# Patient Record
Sex: Female | Born: 1960 | Race: Black or African American | Hispanic: No | Marital: Married | Smoking: Former smoker
Health system: Southern US, Community
[De-identification: ages and names within clinical notes are randomized; demographics above are authoritative.]

## PROBLEM LIST (undated history)

## (undated) ENCOUNTER — Emergency Department (HOSPITAL_COMMUNITY): Admission: EM | Payer: Managed Care, Other (non HMO) | Source: Home / Self Care

## (undated) DIAGNOSIS — K219 Gastro-esophageal reflux disease without esophagitis: Secondary | ICD-10-CM

## (undated) HISTORY — DX: Gastro-esophageal reflux disease without esophagitis: K21.9

## (undated) HISTORY — PX: ABDOMINAL HYSTERECTOMY: SHX81

## (undated) HISTORY — PX: FOOT SURGERY: SHX648

---

## 2006-06-16 ENCOUNTER — Emergency Department (HOSPITAL_COMMUNITY): Admission: EM | Admit: 2006-06-16 | Discharge: 2006-06-16 | Payer: Self-pay | Admitting: Emergency Medicine

## 2007-01-05 ENCOUNTER — Emergency Department (HOSPITAL_COMMUNITY): Admission: EM | Admit: 2007-01-05 | Discharge: 2007-01-05 | Payer: Self-pay | Admitting: Emergency Medicine

## 2010-06-30 ENCOUNTER — Encounter: Payer: Self-pay | Admitting: Orthopedic Surgery

## 2010-06-30 ENCOUNTER — Emergency Department (HOSPITAL_COMMUNITY)
Admission: EM | Admit: 2010-06-30 | Discharge: 2010-06-30 | Payer: Self-pay | Source: Home / Self Care | Admitting: Emergency Medicine

## 2010-07-02 ENCOUNTER — Ambulatory Visit: Payer: Self-pay | Admitting: Orthopedic Surgery

## 2010-07-02 DIAGNOSIS — S92309A Fracture of unspecified metatarsal bone(s), unspecified foot, initial encounter for closed fracture: Secondary | ICD-10-CM | POA: Insufficient documentation

## 2010-07-02 DIAGNOSIS — S92209A Fracture of unspecified tarsal bone(s) of unspecified foot, initial encounter for closed fracture: Secondary | ICD-10-CM | POA: Insufficient documentation

## 2010-07-13 ENCOUNTER — Encounter: Payer: Self-pay | Admitting: Orthopedic Surgery

## 2010-07-18 ENCOUNTER — Ambulatory Visit: Payer: Self-pay | Admitting: Orthopedic Surgery

## 2010-07-19 ENCOUNTER — Encounter: Payer: Self-pay | Admitting: Orthopedic Surgery

## 2010-07-20 ENCOUNTER — Encounter: Payer: Self-pay | Admitting: Orthopedic Surgery

## 2010-08-15 ENCOUNTER — Encounter: Payer: Self-pay | Admitting: Orthopedic Surgery

## 2010-08-15 ENCOUNTER — Ambulatory Visit
Admission: RE | Admit: 2010-08-15 | Discharge: 2010-08-15 | Payer: Self-pay | Source: Home / Self Care | Attending: Orthopedic Surgery | Admitting: Orthopedic Surgery

## 2010-08-16 ENCOUNTER — Encounter: Payer: Self-pay | Admitting: Orthopedic Surgery

## 2010-08-20 ENCOUNTER — Telehealth: Payer: Self-pay | Admitting: Orthopedic Surgery

## 2010-09-11 NOTE — Assessment & Plan Note (Signed)
Summary: [facility] FX LEFT FOOT/BAS/INS/BSF   Visit Type:  new patient Referring Provider:  [facility]  CC:  left foot fracture.  History of Present Illness: I saw Bethany Winters in the office today for an initial visit.  She is a 50 years old woman with the complaint of:  left foot fracture  DOI 06-29-10. Fell  [facility] on 06-30-10.   Xrays were taken and shows fractures involving the bases of the first through fourth metatarsals.  Medications: Hydrocodone 5mg .  The patient got out of bed. Her foot was caught in a side rail and she fell down.  She complains of midfoot pain which is throbbing constant and associated with 8/10 in intensity relief. By 1-2 hydrocodone tablets. Her foot is swollen in the midfoot as well.  Allergies (verified): No Known Drug Allergies  Review of Systems Constitutional:  Denies weight loss, weight gain, fever, chills, and fatigue. Cardiovascular:  Denies chest pain, palpitations, fainting, and murmurs. Respiratory:  Denies short of breath, wheezing, couch, tightness, pain on inspiration, and snoring . Gastrointestinal:  Denies heartburn, nausea, vomiting, diarrhea, constipation, and blood in your stools. Genitourinary:  Denies frequency, urgency, difficulty urinating, painful urination, flank pain, and bleeding in urine. Neurologic:  Denies numbness, tingling, unsteady gait, dizziness, tremors, and seizure. Musculoskeletal:  Denies joint pain, swelling, instability, stiffness, redness, heat, and muscle pain. Endocrine:  Denies excessive thirst, exessive urination, and heat or cold intolerance. Psychiatric:  Denies nervousness, depression, anxiety, and hallucinations. Skin:  Denies changes in the skin, poor healing, rash, itching, and redness. HEENT:  Denies blurred or double vision, eye pain, redness, and watering. Immunology:  Denies seasonal allergies, sinus problems, and allergic to bee stings. Hemoatologic:  Denies easy bleeding and brusing.  Physical  Exam  Skin:  intact without lesions or rashes Psych:  alert and cooperative; normal mood and affect; normal attention span and concentration   Foot/Ankle Exam  General:    The patient is well developed and nourished, with normal grooming and hygiene. The body habitus is normal  Gait:    abnormal gait pattern with crutches, nonweightbearing  Skin:    Intact with no scars, lesions, rashes, cafe-au-lait spots or bruising.    Inspection:    swelling: LEFT foot  Palpation:    tenderness along the entire midfoot with no instability with the drawer test of the tarsometatarsal joint  Vascular:    dorsalis pedis and posterior tibial pulses 2+ and symmetric, capillary refill < 2 seconds, normal hair pattern, no evidence of ischemia.   Sensory:    gross sensation intact bilaterally in lower extremities.    Motor:    normal muscle tone   Ankle Exam:    Left:    Inspection:  Abnormal    Palpation:  Abnormal    Stability:  stable    Tenderness:  yes, midfoot     Swelling:  yes   Impression & Recommendations:  Problem # 1:  OTHER CLOSED FRACTURE OF TARSAL&METATARSAL BONES (ICD-825.29) Assessment New  The x-rays were done at [facility]. The report and the films have been reviewed. the foot alignment is normal there are fractures 1-5 with no displacement;   I strongly emphasized NO WEIGHT BEARING or it may displace and need surgery   Orders: New Patient Level III (06237) Metacarpal Fx (62831)  Medications Added to Medication List This Visit: 1)  Norco 10-325 Mg Tabs (Hydrocodone-acetaminophen) .Marland Kitchen.. 1 by mouth q 4 as needed  Patient Instructions: 1)  No weightbearing at all  2)  Do not get cast wet, if you do call us  3)  Come back in 2 weeks xrays in plaster 4)  OOW for 6 weeks Prescriptions: NORCO 10-325 MG TABS (HYDROCODONE-ACETAMINOPHEN) 1 by mouth q 4 as needed  #84 x 0   Entered and Authorized by:   Fuller Canada MD   Signed by:   Fuller Canada MD on 07/02/2010    Method used:   Print then Give to Patient   RxID:   [id number]    Orders Added: 1)  New Patient Level III [62130] 2)  Metacarpal Fx [26600]

## 2010-09-11 NOTE — Letter (Signed)
Summary: Out of Work  [facility]  [address]  [place], [place] [address]   Phone: [phone]  Fax: [phone]    July 02, 2010   Employee:  Bethany Winters    To Whom It May Concern:   For Medical reasons, please excuse the above named employee from work for the following dates:  Start out of work :   Monday, 07/02/10             End:   Tuesday, August 14, 2009, or until further notice  If you need additional information, please feel free to contact our office.         Sincerely,    Terrance Mass, MD

## 2010-09-11 NOTE — Letter (Signed)
Summary: Out of Work  [facility]  [address]  [place], [place] [address]   Phone: [phone]  Fax: [phone]    July 18, 2010   Employee:  ANARIA KRONER Rettinger    To Whom It May Concern:   For Medical reasons, please excuse the above named employee from work for the following dates:  Start out of work :    Monday, 07/02/10             Estimated End date:   08/27/10,  or until further notice   Next appointment date:  08/15/10    If you need additional information, please feel free to contact our office.         Sincerely,    Terrance Mass, MD

## 2010-09-11 NOTE — Assessment & Plan Note (Signed)
Summary: 2 WK RE-CK/XRAYS LT FOOT/FX CARE/CIG[employer]/CAF   Visit Type:  Follow-up Referring Dali Kraner:  [facility]  CC:  left foot fracture.  History of Present Illness: I saw Bethany Winters in the office today for a 2 week followup visit.  She is a 50 years old woman with the complaint of:  left foot  ZO:XWRUEAVWU #1 through 4, LEFT foot  DOI: November 18  Treatment and date: short-leg nonweightbearing cast  Plan for today: x-rays in cast  Meds: norco 10/25   Complaints: none   x-rays today show that the midtarsal joints are intact. The metatarsal tarsal joints are intact. Fractures are healing appropriately, in good position.  Continue nonweightbearing. X-rays out of plaster next visit    Allergies: No Known Drug Allergies   Other Orders: Post-Op Check (98119) Foot x-ray complete, minimum 3 views (14782)  Patient Instructions: 1)  return 1 st week of Jan for cast off xrays  2)  stay no weight bearing    Orders Added: 1)  Post-Op Check [99024] 2)  Foot x-ray complete, minimum 3 views [73630]

## 2010-09-11 NOTE — Letter (Signed)
Summary: History form  History form   Imported By: Jacklynn Ganong 07/04/2010 08:33:14  _____________________________________________________________________  External Attachment:    Type:   Image     Comment:   External Document

## 2010-09-11 NOTE — Letter (Signed)
Summary: Out of Work Updated  [facility]  [address]  [place], [place] [address]   Phone: [phone]  Fax: [phone]    July 13, 2010   Employee:  Bethany Winters    To Whom It May Concern:   For Medical reasons, please excuse the above named employee from work for the following dates:   Start out of work :    Monday, 07/02/10                         End:     Tuesday,  08/14/10, or until further notice                          (Next Appointment: 07/18/10 @ 11:30AM)    If you need additional information, please feel free to contact our office.         Sincerely,    Terrance Mass, MD

## 2010-09-13 NOTE — Letter (Signed)
Summary: [employer] dis[employer] insurance func abilities form  [employer] dis[employer] insurance func abilities form   Imported By: Cammie Sickle 08/21/2010 14:48:49  _____________________________________________________________________  External Attachment:    Type:   Image     Comment:   External Document

## 2010-09-13 NOTE — Letter (Signed)
Summary: [employer] disab form  [employer] dis[employer] form   Imported By: Cammie Sickle 07/23/2010 13:41:13  _____________________________________________________________________  External Attachment:    Type:   Image     Comment:   External Document

## 2010-09-13 NOTE — Letter (Signed)
Summary: Disability form  Disability form   Imported By: Cammie Sickle 08/10/2010 17:18:24  _____________________________________________________________________  External Attachment:    Type:   Image     Comment:   External Document

## 2010-09-13 NOTE — Medication Information (Signed)
Summary: RX Folder  RX Folder   Imported By: Cammie Sickle 08/15/2010 19:44:16  _____________________________________________________________________  External Attachment:    Type:   Image     Comment:   External Document

## 2010-09-13 NOTE — Letter (Signed)
Summary: Out of Work  [facility]  [address]  [place], [place] [address]   Phone: [phone]  Fax: [phone]    August 15, 2010   Employee:  KAYLIEE ATIENZA Schertzer    To Whom It May Concern:   For Medical reasons, please excuse the above named employee from work for the following dates:  Start:   07/02/2010  End/Medically cleared to return to work full duty, no restrictions:  08/27/2010   If you need additional information, please feel free to contact our office.         Sincerely,    Terrance Mass, MD

## 2010-09-13 NOTE — Progress Notes (Signed)
Summary: foot is swelling  Phone Note Call from Patient   Summary of Call: Bethany Winters (Feb 05, 2061) concerned that her foot is still swollen, she is icing and elevating and walking on it some.  Scheduled to go back to work 08/27/10 and her shoe will not fit her foot due to the swelling.  Anything else she can do to get the swelling down? [phone]  Follow-up for Phone Call        order her a ted hose low pressure use pre pprinted car apoth orders  Follow-up by: Fuller Canada MD,  August 20, 2010 1:54 PM  Additional Follow-up for Phone Call Additional follow up Details #1::        see fol/up note, ordered 08/20/10 [facility] per nurse. Additional Follow-up by: Cammie Sickle,  August 21, 2010 9:07 AM    New/Updated Medications: * TED HOSE wear 8 hrs per day for swelling Prescriptions: TED HOSE wear 8 hrs per day for swelling  #1 pair x 0   Entered by:   Ether Griffins   Authorized by:   Fuller Canada MD   Signed by:   Ether Griffins on 08/20/2010   Method used:   Faxed to ...       [facility]* (retail)       [address]       [place], [place]  [address]       Ph: [phone]       Fax: [phone]   RxID:   [id number]

## 2010-09-13 NOTE — Progress Notes (Signed)
Summary: knee or thigh high hose?  Phone Note Other Incoming   Summary of Call: Bethany Winters ([birth year]) Doris at [facility] needs to know if the stockings for Bethany Winters is knee or thigh high. Doris's # [phone] Initial call taken by: Jacklynn Ganong,  August 20, 2010 3:17 PM  Follow-up for Phone Call        knee Follow-up by: Fuller Canada MD,  August 20, 2010 3:20 PM  Additional Follow-up for Phone Call Additional follow up Details #1::        Doris/[facility] advised Additional Follow-up by: Jacklynn Ganong,  August 20, 2010 3:23 PM

## 2010-09-13 NOTE — Assessment & Plan Note (Signed)
Summary: RE-CK/XRAYS LT FOOT/FX CARE/[employer]IG[employer]/CAF   Visit Type:  Follow-up Referring Provider:  [facility]  CC:  left foot fracture.  History of Present Illness: I saw Laterria Gasca in the office today for a followup visit.  She is a 50 years old woman with the complaint of:  left foot fracture.  ZO:XWRUEAVWU #1 through 4, LEFT foot/LISFRANC ?   DOI: November 18  7 WEEKS +   Treatment and date: short-leg nonweightbearing cast  Plan for today: x-rays OOP  Meds: norco 7.5 as needed.  Complaints: none.  Allergies: No Known Drug Allergies   Impression & Recommendations:  Problem # 1:  OTHER CLOSED FRACTURE OF TARSAL&METATARSAL BONES (ICD-825.29)  3 views LEFT foot  x-rays today x-rays show osteopenia multiple fractures seemed to have healed nondisplaced no displacement of the Lisfranc joint  Impression healed multiple fractures near the Lisfranc joint  Recommended orthotics for 6 weeks return to work on 16 January follow up as needed  Orders: Post-Op Check (98119) Foot x-ray complete, minimum 3 views (14782)  Patient Instructions: 1)  Get orthotics from [facility] 2)  Wean off of the crutches 3)  Go back to work 08/27/10 needs note 4)  Come back as needed.   Orders Added: 1)  Post-Op Check [99024] 2)  Foot x-ray complete, minimum 3 views [73630]

## 2010-09-13 NOTE — Letter (Signed)
Summary: Work note fax to employer  Work note fax to employer   Imported By: Cammie Sickle 08/15/2010 19:43:58  _____________________________________________________________________  External Attachment:    Type:   Image     Comment:   External Document

## 2010-12-24 ENCOUNTER — Emergency Department (HOSPITAL_COMMUNITY)
Admission: EM | Admit: 2010-12-24 | Discharge: 2010-12-24 | Disposition: A | Discharge status: Home / Self Care | Payer: 59 | Attending: Emergency Medicine | Admitting: Emergency Medicine

## 2010-12-24 DIAGNOSIS — L259 Unspecified contact dermatitis, unspecified cause: Secondary | ICD-10-CM | POA: Insufficient documentation

## 2010-12-24 DIAGNOSIS — F17200 Nicotine dependence, unspecified, uncomplicated: Secondary | ICD-10-CM | POA: Insufficient documentation

## 2010-12-24 DIAGNOSIS — R21 Rash and other nonspecific skin eruption: Secondary | ICD-10-CM | POA: Insufficient documentation

## 2012-07-05 ENCOUNTER — Emergency Department (HOSPITAL_COMMUNITY)
Admission: EM | Admit: 2012-07-05 | Discharge: 2012-07-05 | Disposition: A | Discharge status: Home / Self Care | Payer: Managed Care, Other (non HMO) | Attending: Emergency Medicine | Admitting: Emergency Medicine

## 2012-07-05 ENCOUNTER — Emergency Department (HOSPITAL_COMMUNITY): Payer: Managed Care, Other (non HMO)

## 2012-07-05 ENCOUNTER — Encounter (HOSPITAL_COMMUNITY): Payer: Self-pay | Admitting: *Deleted

## 2012-07-05 DIAGNOSIS — F17200 Nicotine dependence, unspecified, uncomplicated: Secondary | ICD-10-CM | POA: Insufficient documentation

## 2012-07-05 DIAGNOSIS — R0789 Other chest pain: Secondary | ICD-10-CM

## 2012-07-05 DIAGNOSIS — Z8719 Personal history of other diseases of the digestive system: Secondary | ICD-10-CM | POA: Insufficient documentation

## 2012-07-05 LAB — HEPATIC FUNCTION PANEL
Alkaline Phosphatase: 82 U/L (ref 39–117)
Total Bilirubin: 0.1 mg/dL — ABNORMAL LOW (ref 0.3–1.2)
Total Protein: 7.3 g/dL (ref 6.0–8.3)

## 2012-07-05 LAB — BASIC METABOLIC PANEL
Calcium: 9.4 mg/dL (ref 8.4–10.5)
GFR calc Af Amer: >90 mL/min (ref >90)
GFR calc non Af Amer: >90 mL/min (ref >90)
Glucose, Bld: 117 mg/dL — ABNORMAL HIGH (ref 70–99)
Sodium: 141 mEq/L (ref 135–145)

## 2012-07-05 LAB — CBC
Hemoglobin: 13.7 g/dL (ref 12.0–15.0)
MCH: 29.7 pg (ref 26.0–34.0)
MCHC: 33.7 g/dL (ref 30.0–36.0)
RDW: 13.6 % (ref 11.5–15.5)

## 2012-07-05 LAB — LIPASE, BLOOD: Lipase: 41 U/L (ref 11–59)

## 2012-07-05 MED ORDER — FAMOTIDINE 20 MG PO TABS: 20.0000 mg | ORAL_TABLET | Freq: Two times a day (BID) | ORAL | Status: DC

## 2012-07-05 MED ORDER — IBUPROFEN 600 MG PO TABS: 600.0000 mg | ORAL_TABLET | Freq: Four times a day (QID) | ORAL | Status: DC | PRN

## 2012-07-05 MED ORDER — GI COCKTAIL ~~LOC~~
30.0000 mL | Freq: Once | ORAL | Status: AC
  Administered 2012-07-05: 30 mL via ORAL
  Filled 2012-07-05: qty 30

## 2012-07-05 MED ORDER — NITROGLYCERIN 0.4 MG SL SUBL
0.4000 mg | SUBLINGUAL_TABLET | Freq: Once | SUBLINGUAL | Status: AC
  Administered 2012-07-05: 0.4 mg via SUBLINGUAL

## 2012-07-05 MED ORDER — ASPIRIN 81 MG PO CHEW
324.0000 mg | CHEWABLE_TABLET | Freq: Once | ORAL | Status: AC
  Administered 2012-07-05: 324 mg via ORAL
  Filled 2012-07-05: qty 4

## 2012-07-05 MED ORDER — NITROGLYCERIN 0.4 MG SL SUBL
0.4000 mg | SUBLINGUAL_TABLET | Freq: Once | SUBLINGUAL | Status: AC
  Administered 2012-07-05: 0.4 mg via SUBLINGUAL
  Filled 2012-07-05: qty 25

## 2012-07-05 NOTE — ED Provider Notes (Signed)
History     CSN: [id number]  Arrival date & time 07/05/12  0806   First MD Initiated Contact with Patient 07/05/12 0818      Chief Complaint  Patient presents with  . Chest Pain    (Consider location/radiation/quality/duration/timing/severity/associated sxs/prior treatment) HPI Comments: Bethany Winters presents with constant midsternal chest "aching" which radiates into her mid back since last night.  She describes sitting on her couch watching a movie,  When she developed symptoms around 1 am.  She denies any shortness of breath, nausea, vomiting,  Diaphoresis,  Abdominal pain or palpitations.  She does have a history of acid reflux but states this is not a burning pain like her usual reflux.  She has taken no medications prior to arrival.  She is a smoker, but denies other risk factors for cardiac disease including no history of hypertension,  Elevated cholesterol (was just checked at a recent job health fair) and no family history of cardiac disease.  She has had no swelling or pain in her legs.  She has taken no medications prior to arrival.  The history is provided by the patient and the spouse.    History reviewed. No pertinent past medical history.  Past Surgical History  Procedure Date  . Foot surgery     left  . Abdominal hysterectomy     partial    No family history on file.  History  Substance Use Topics  . Smoking status: Current Every Day Smoker    Types: Cigarettes  . Smokeless tobacco: Not on file  . Alcohol Use: Yes     Comment: weekends    OB History    Grav Para Term Preterm Abortions TAB SAB Ect Mult Living                  Review of Systems  Constitutional: Negative for fever.  HENT: Negative for congestion, sore throat and neck pain.   Eyes: Negative.   Respiratory: Negative for cough, shortness of breath and wheezing.   Cardiovascular: Positive for chest pain. Negative for palpitations and leg swelling.  Gastrointestinal: Negative for  nausea and abdominal pain.  Genitourinary: Negative.   Musculoskeletal: Negative for joint swelling and arthralgias.  Skin: Negative.  Negative for rash and wound.  Neurological: Negative for dizziness, weakness, light-headedness, numbness and headaches.  Hematological: Negative.   Psychiatric/Behavioral: Negative.     Allergies  Review of patient's allergies indicates no known allergies.  Home Medications  No current outpatient prescriptions on file.  BP 144/93  Pulse 66  Temp 98.9 F (37.2 C) (Oral)  Resp 16  Ht 5\' 4"  (1.626 m)  Wt 140 lb (63.504 kg)  BMI 24.03 kg/m2  SpO2 100%  LMP 07/05/2012  Physical Exam  Nursing note and vitals reviewed. Constitutional: She appears well-developed and well-nourished.  HENT:  Head: Normocephalic and atraumatic.  Eyes: Conjunctivae normal are normal.  Neck: Normal range of motion.  Cardiovascular: Normal rate, regular rhythm, normal heart sounds and intact distal pulses.  Exam reveals no gallop and no friction rub.   No murmur heard. Pulmonary/Chest: Effort normal and breath sounds normal. She has no wheezes. She exhibits tenderness.       Mild ttp midsternum.  Abdominal: Soft. Bowel sounds are normal. There is no tenderness.  Musculoskeletal: Normal range of motion. She exhibits no edema and no tenderness.  Neurological: She is alert.  Skin: Skin is warm and dry.  Psychiatric: She has a normal mood and affect.  ED Course  Procedures (including critical care time)   Labs Reviewed  CBC  BASIC METABOLIC PANEL  TROPONIN I  HEPATIC FUNCTION PANEL  LIPASE, BLOOD   Dg Chest Port 1 View  07/05/2012  *RADIOLOGY REPORT*  Clinical Data: Chest pain  CHEST - 1 VIEW  Comparison:  None.  Findings: The heart size and mediastinal contours are within normal limits.  Both lungs are clear.  IMPRESSION: No active disease.   Original Report Authenticated By: Judie Petit. Miles Costain, M.D.      No diagnosis found.    MDM  Labs and xray reviewed and  discussed with pt.  Atypical chest pain of unclear etiology,  However,  She did get some improvement once the GI cocktail was given.  Will cover for possible atypical gerd with pepcid.  Also with slightly reproducible quality of pain,  Will add ibuprofen for possible costochondritis.  Referral given for pcp,  Also for cardiology.  Stressed to return here for any worsened or persistent symptoms.  Pt is PERC negative with no risk factors or presentation suggesting PE.    Date: 07/05/2012  Rate: 73  Rhythm: normal sinus rhythm  QRS Axis: normal  Intervals: normal  ST/T Wave abnormalities: normal  Conduction Disutrbances:none  Narrative Interpretation:   Old EKG Reviewed: none available       Burgess Amor, PA 07/05/12 1641

## 2012-07-05 NOTE — ED Notes (Signed)
Central cp that started last night.  Denies sob/n/v/d/dizziness.

## 2012-07-06 NOTE — ED Provider Notes (Signed)
Medical screening examination/treatment/procedure(s) were performed by non-physician practitioner and as supervising physician I was immediately available for consultation/collaboration.   Karle Desrosier L Nam Vossler, MD 07/06/12 1308 

## 2012-07-21 ENCOUNTER — Other Ambulatory Visit (HOSPITAL_COMMUNITY): Payer: Self-pay | Admitting: Internal Medicine

## 2012-07-21 DIAGNOSIS — Z139 Encounter for screening, unspecified: Secondary | ICD-10-CM

## 2012-07-27 ENCOUNTER — Ambulatory Visit (HOSPITAL_COMMUNITY): Payer: 59

## 2012-08-10 ENCOUNTER — Ambulatory Visit (HOSPITAL_COMMUNITY)
Admission: RE | Admit: 2012-08-10 | Discharge: 2012-08-10 | Disposition: A | Discharge status: Home / Self Care | Payer: Managed Care, Other (non HMO) | Source: Ambulatory Visit | Attending: Internal Medicine | Admitting: Internal Medicine

## 2012-08-10 DIAGNOSIS — Z139 Encounter for screening, unspecified: Secondary | ICD-10-CM

## 2012-08-10 DIAGNOSIS — Z1231 Encounter for screening mammogram for malignant neoplasm of breast: Secondary | ICD-10-CM | POA: Insufficient documentation

## 2013-01-20 ENCOUNTER — Emergency Department (HOSPITAL_COMMUNITY)
Admission: EM | Admit: 2013-01-20 | Discharge: 2013-01-20 | Disposition: A | Discharge status: Home / Self Care | Payer: Managed Care, Other (non HMO) | Attending: Emergency Medicine | Admitting: Emergency Medicine

## 2013-01-20 ENCOUNTER — Emergency Department (HOSPITAL_COMMUNITY): Payer: Managed Care, Other (non HMO)

## 2013-01-20 ENCOUNTER — Encounter (HOSPITAL_COMMUNITY): Payer: Self-pay | Admitting: Emergency Medicine

## 2013-01-20 DIAGNOSIS — F17200 Nicotine dependence, unspecified, uncomplicated: Secondary | ICD-10-CM | POA: Insufficient documentation

## 2013-01-20 DIAGNOSIS — S92912A Unspecified fracture of left toe(s), initial encounter for closed fracture: Secondary | ICD-10-CM

## 2013-01-20 DIAGNOSIS — Y9289 Other specified places as the place of occurrence of the external cause: Secondary | ICD-10-CM | POA: Insufficient documentation

## 2013-01-20 DIAGNOSIS — Y990 Civilian activity done for income or pay: Secondary | ICD-10-CM | POA: Insufficient documentation

## 2013-01-20 DIAGNOSIS — S92919A Unspecified fracture of unspecified toe(s), initial encounter for closed fracture: Secondary | ICD-10-CM | POA: Insufficient documentation

## 2013-01-20 DIAGNOSIS — Y9302 Activity, running: Secondary | ICD-10-CM | POA: Insufficient documentation

## 2013-01-20 DIAGNOSIS — W2209XA Striking against other stationary object, initial encounter: Secondary | ICD-10-CM | POA: Insufficient documentation

## 2013-01-20 DIAGNOSIS — Z9889 Other specified postprocedural states: Secondary | ICD-10-CM | POA: Insufficient documentation

## 2013-01-20 MED ORDER — HYDROCODONE-ACETAMINOPHEN 5-325 MG PO TABS
ORAL_TABLET | ORAL | Status: DC
Start: 2013-01-20 — End: 2015-09-27

## 2013-01-20 MED ORDER — NAPROXEN 500 MG PO TABS: 500.0000 mg | ORAL_TABLET | Freq: Two times a day (BID) | ORAL | Status: DC

## 2013-01-20 NOTE — ED Notes (Signed)
Pt c/o left 5th toe pain after hitting on couch yesterday evening.

## 2013-01-20 NOTE — Discharge Instructions (Signed)
Toe Fracture Your caregiver has diagnosed you as having a fractured toe. A toe fracture is a break in the bone of a toe. "Buddy taping" is a way of splinting your broken toe, by taping the broken toe to the toe next to it. This "buddy taping" will keep the injured toe from moving beyond normal range of motion. Buddy taping also helps the toe heal in a more normal alignment. It may take 6 to 8 weeks for the toe injury to heal. HOME CARE INSTRUCTIONS   Leave your toes taped together for as long as directed by your caregiver or until you see a doctor for a follow-up examination. You can change the tape after bathing. Always use a small piece of gauze or cotton between the toes when taping them together. This will help the skin stay dry and prevent infection.  Apply ice to the injury for 15-20 minutes each hour while awake for the first 2 days. Put the ice in a plastic bag and place a towel between the bag of ice and your skin.  After the first 2 days, apply heat to the injured area. Use heat for the next 2 to 3 days. Place a heating pad on the foot or soak the foot in warm water as directed by your caregiver.  Keep your foot elevated as much as possible to lessen swelling.  Wear sturdy, supportive shoes. The shoes should not pinch the toes or fit tightly against the toes.  Your caregiver may prescribe a rigid shoe if your foot is very swollen.  Your may be given crutches if the pain is too great and it hurts too much to walk.  Only take over-the-counter or prescription medicines for pain, discomfort, or fever as directed by your caregiver.  If your caregiver has given you a follow-up appointment, it is very important to keep that appointment. Not keeping the appointment could result in a chronic or permanent injury, pain, and disability. If there is any problem keeping the appointment, you must call back to this facility for assistance. SEEK MEDICAL CARE IF:   You have increased pain or swelling,  not relieved with medications.  The pain does not get better after 1 week.  Your injured toe is cold when the others are warm. SEEK IMMEDIATE MEDICAL CARE IF:   The toe becomes cold, numb, or white.  The toe becomes hot (inflamed) and red. Document Released: 07/26/2000 Document Revised: 10/21/2011 Document Reviewed: 03/14/2008 [facility] Patient Information 2014 [place], [place].  Hard-Soled Shoe Use This is a flat, soft shoe with a hard (sometimes wood) sole. It is used for toe fractures and certain foot surgeries. Your doctor will tell you how much weight to put on your foot. HOME CARE INSTRUCTIONS   Lace the shoe to make it secure and comfortable. You do not want to feel pressure or rubbing on the painful area.  Follow instructions for wear as directed by your caregiver. Document Released: 05/03/2004 Document Revised: 10/21/2011 Document Reviewed: 07/29/2005 [facility] Patient Information 2014 [place], [place].

## 2013-01-20 NOTE — ED Notes (Signed)
Pt presents with left 5th toe injury and swelling after running into sofa last night. Pt reports buddy taped toes together, has helped, however is painful to wear steel-toed shoes at work. NAD noted. Pain has increased due to standing at work.

## 2013-01-23 NOTE — ED Provider Notes (Signed)
History     CSN: [id number]  Arrival date & time 01/20/13  1203   First MD Initiated Contact with Patient 01/20/13 1251      Chief Complaint  Patient presents with  . Toe Pain    (Consider location/radiation/quality/duration/timing/severity/associated sxs/prior treatment) HPI Comments: Bethany Winters is a 52 y.o. female who presents to the Emergency Department complaining of left fifth toe pain after a direct blow against a sofa.  C/o pain and swelling.  Pt has tried tylenol and ice packs without relief.  States that she has to wear steel toe shoes at her job and has pain with attempted walking or standing.  She denies proximal pain or swelling.   Patient is a 52 y.o. female presenting with toe pain. The history is provided by the patient.  Toe Pain This is a new problem. The current episode started yesterday. The problem occurs constantly. The problem has been unchanged. Associated symptoms include arthralgias and joint swelling. Pertinent negatives include no chills, fever, neck pain, numbness, rash, sore throat, vomiting or weakness. The symptoms are aggravated by standing and walking. She has tried acetaminophen and ice for the symptoms. The treatment provided no relief.    History reviewed. No pertinent past medical history.  Past Surgical History  Procedure Laterality Date  . Foot surgery      left  . Abdominal hysterectomy      partial    No family history on file.  History  Substance Use Topics  . Smoking status: Current Every Day Smoker -- 0.50 packs/day    Types: Cigarettes  . Smokeless tobacco: Not on file  . Alcohol Use: Yes     Comment: weekends    OB History   Grav Para Term Preterm Abortions TAB SAB Ect Mult Living                  Review of Systems  Constitutional: Negative for fever and chills.  HENT: Negative for sore throat and neck pain.   Gastrointestinal: Negative for vomiting.  Genitourinary: Negative for dysuria and difficulty urinating.   Musculoskeletal: Positive for joint swelling, arthralgias and gait problem. Negative for back pain.  Skin: Negative for color change, rash and wound.  Neurological: Negative for weakness and numbness.  All other systems reviewed and are negative.    Allergies  Review of patient's allergies indicates no known allergies.  Home Medications   Current Outpatient Rx  Name  Route  Sig  Dispense  Refill  . acetaminophen (TYLENOL) 500 MG tablet   Oral   Take 1,000 mg by mouth every 4 (four) hours as needed for pain.         Marland Kitchen HYDROcodone-acetaminophen (NORCO/VICODIN) 5-325 MG per tablet      Take one-two tabs po q 4-6 hrs prn pain   20 tablet   0   . naproxen (NAPROSYN) 500 MG tablet   Oral   Take 1 tablet (500 mg total) by mouth 2 (two) times daily.   20 tablet   0     BP 108/76  Pulse 73  Temp(Src) 97.9 F (36.6 C) (Oral)  Resp 20  Ht 5\' 4"  (1.626 m)  Wt 138 lb (62.596 kg)  BMI 23.68 kg/m2  SpO2 96%  LMP 07/05/2012  Physical Exam  Nursing note and vitals reviewed. Constitutional: She is oriented to person, place, and time. She appears well-developed and well-nourished. No distress.  HENT:  Head: Normocephalic and atraumatic.  Cardiovascular: Normal rate, regular rhythm, normal  heart sounds and intact distal pulses.   No murmur heard. Pulmonary/Chest: Effort normal and breath sounds normal. No respiratory distress.  Musculoskeletal: She exhibits edema and tenderness.  ttp of the left fifth toe.  Mild STS.  ROM is preserved.  DP pulse is brisk, distal sensation intact.  No erythema, abrasion, open wound, red streaks or bony deformity.    Neurological: She is alert and oriented to person, place, and time. She exhibits normal muscle tone. Coordination normal.  Skin: Skin is warm and dry.    ED Course  Procedures (including critical care time)  Labs Reviewed - No data to display No results found. Dg Foot Complete Left  01/20/2013   *RADIOLOGY REPORT*  Clinical  Data: Fifth toe injury with pain.  LEFT FOOT - COMPLETE 3+ VIEW  Comparison: 06/30/2010.  Findings: There is a nondisplaced fracture involving the fifth proximal phalangeal shaft.  Fracture line does not appear to extend to the metatarsal phalangeal joint.  Healed fractures at the bases of the first through fourth metatarsals are seen.  Small calcaneal spur.  IMPRESSION:  1.  Acute nondisplaced fracture of the fifth proximal shaft. 2.  Healed fractures of the bases of the first through fourth metatarsals.   Original Report Authenticated By: Leanna Battles, M.D.    1. Toe fracture, left, closed, initial encounter       MDM    Left 4th and 5th toes were buddy taped, pain improved, remains NV intact.  Post-op shoe applied.  Pt agrees to rest, elevate extremity and ice.  Given referral info for orthopedics if needed   VSS.  Appears stable for d/c   Welma Mccombs L. Tiernan Millikin, PA-C 01/23/13 1708

## 2013-01-31 NOTE — ED Provider Notes (Signed)
Medical screening examination/treatment/procedure(s) were performed by non-physician practitioner and as supervising physician I was immediately available for consultation/collaboration.   Caden Fatica L Britney Captain, MD 01/31/13 0254 

## 2013-07-27 ENCOUNTER — Other Ambulatory Visit (HOSPITAL_COMMUNITY): Payer: Self-pay | Admitting: Internal Medicine

## 2013-07-27 DIAGNOSIS — Z139 Encounter for screening, unspecified: Secondary | ICD-10-CM

## 2013-08-16 ENCOUNTER — Ambulatory Visit (HOSPITAL_COMMUNITY)
Admission: RE | Admit: 2013-08-16 | Discharge: 2013-08-16 | Disposition: A | Discharge status: Home / Self Care | Payer: Managed Care, Other (non HMO) | Source: Ambulatory Visit | Attending: Internal Medicine | Admitting: Internal Medicine

## 2013-08-16 DIAGNOSIS — Z139 Encounter for screening, unspecified: Secondary | ICD-10-CM

## 2013-08-16 DIAGNOSIS — Z1231 Encounter for screening mammogram for malignant neoplasm of breast: Secondary | ICD-10-CM | POA: Insufficient documentation

## 2013-10-29 ENCOUNTER — Encounter (HOSPITAL_COMMUNITY): Payer: Self-pay | Admitting: Emergency Medicine

## 2013-10-29 ENCOUNTER — Emergency Department (HOSPITAL_COMMUNITY): Payer: Managed Care, Other (non HMO)

## 2013-10-29 ENCOUNTER — Emergency Department (HOSPITAL_COMMUNITY)
Admission: EM | Admit: 2013-10-29 | Discharge: 2013-10-29 | Disposition: A | Discharge status: Home / Self Care | Payer: Managed Care, Other (non HMO) | Attending: Emergency Medicine | Admitting: Emergency Medicine

## 2013-10-29 DIAGNOSIS — Y929 Unspecified place or not applicable: Secondary | ICD-10-CM | POA: Insufficient documentation

## 2013-10-29 DIAGNOSIS — S93409A Sprain of unspecified ligament of unspecified ankle, initial encounter: Secondary | ICD-10-CM | POA: Insufficient documentation

## 2013-10-29 DIAGNOSIS — Z791 Long term (current) use of non-steroidal anti-inflammatories (NSAID): Secondary | ICD-10-CM | POA: Insufficient documentation

## 2013-10-29 DIAGNOSIS — S93401A Sprain of unspecified ligament of right ankle, initial encounter: Secondary | ICD-10-CM

## 2013-10-29 DIAGNOSIS — X500XXA Overexertion from strenuous movement or load, initial encounter: Secondary | ICD-10-CM | POA: Insufficient documentation

## 2013-10-29 DIAGNOSIS — W19XXXA Unspecified fall, initial encounter: Secondary | ICD-10-CM | POA: Insufficient documentation

## 2013-10-29 DIAGNOSIS — Y939 Activity, unspecified: Secondary | ICD-10-CM | POA: Insufficient documentation

## 2013-10-29 DIAGNOSIS — F17200 Nicotine dependence, unspecified, uncomplicated: Secondary | ICD-10-CM | POA: Insufficient documentation

## 2013-10-29 MED ORDER — IBUPROFEN 800 MG PO TABS
800.0000 mg | ORAL_TABLET | Freq: Once | ORAL | Status: AC
  Administered 2013-10-29: 800 mg via ORAL
  Filled 2013-10-29: qty 1

## 2013-10-29 MED ORDER — HYDROCODONE-ACETAMINOPHEN 5-325 MG PO TABS: ORAL_TABLET | ORAL | Status: DC

## 2013-10-29 MED ORDER — IBUPROFEN 800 MG PO TABS
800.0000 mg | ORAL_TABLET | Freq: Three times a day (TID) | ORAL | Status: DC
Start: 2013-10-29 — End: 2015-09-27

## 2013-10-29 MED ORDER — HYDROCODONE-ACETAMINOPHEN 5-325 MG PO TABS
1.0000 | ORAL_TABLET | Freq: Once | ORAL | Status: AC
  Administered 2013-10-29: 1 via ORAL
  Filled 2013-10-29: qty 1

## 2013-10-29 NOTE — ED Provider Notes (Signed)
CSN: [id number]     Arrival date & time 10/29/13  1709 History   First MD Initiated Contact with Patient 10/29/13 1817     Chief Complaint  Patient presents with  . Ankle Pain     (Consider location/radiation/quality/duration/timing/severity/associated sxs/prior Treatment) Patient is a 53 y.o. female presenting with ankle pain. The history is provided by the patient.  Ankle Pain Location:  Ankle Time since incident:  12 hours Injury: yes   Mechanism of injury comment:  Twisting Ankle location:  R ankle Pain details:    Quality:  Aching and throbbing   Radiates to:  Does not radiate   Severity:  Moderate   Onset quality:  Gradual   Timing:  Constant   Progression:  Unchanged Chronicity:  New Dislocation: no   Foreign body present:  No foreign bodies Prior injury to area:  No Relieved by:  Nothing Worsened by:  Activity and bearing weight Ineffective treatments:  Compression and elevation Associated symptoms: swelling   Associated symptoms: no back pain, no decreased ROM, no fever, no muscle weakness, no neck pain, no numbness, no stiffness and no tingling     History reviewed. No pertinent past medical history. Past Surgical History  Procedure Laterality Date  . Foot surgery      left  . Abdominal hysterectomy      partial   No family history on file. History  Substance Use Topics  . Smoking status: Current Every Day Smoker -- 0.50 packs/day  . Smokeless tobacco: Not on file  . Alcohol Use: Yes     Comment: weekends   OB History   Grav Para Term Preterm Abortions TAB SAB Ect Mult Living                 Review of Systems  Constitutional: Negative for fever and chills.  Genitourinary: Negative for dysuria and difficulty urinating.  Musculoskeletal: Positive for arthralgias and joint swelling. Negative for back pain, gait problem, neck pain and stiffness.  Skin: Negative for color change and wound.  Neurological: Negative for dizziness and syncope.  All other  systems reviewed and are negative.      Allergies  Review of patient's allergies indicates no known allergies.  Home Medications   Current Outpatient Rx  Name  Route  Sig  Dispense  Refill  . acetaminophen (TYLENOL) 500 MG tablet   Oral   Take 1,000 mg by mouth every 4 (four) hours as needed for pain.         Marland Kitchen HYDROcodone-acetaminophen (NORCO/VICODIN) 5-325 MG per tablet      Take one-two tabs po q 4-6 hrs prn pain   20 tablet   0   . HYDROcodone-acetaminophen (NORCO/VICODIN) 5-325 MG per tablet      Take one-two tabs po q 4-6 hrs prn pain   12 tablet   0   . ibuprofen (ADVIL,MOTRIN) 800 MG tablet   Oral   Take 1 tablet (800 mg total) by mouth 3 (three) times daily.   21 tablet   0   . naproxen (NAPROSYN) 500 MG tablet   Oral   Take 1 tablet (500 mg total) by mouth 2 (two) times daily.   20 tablet   0    BP 108/80  Pulse 78  Temp(Src) 98.2 F (36.8 C)  Resp 18  Ht 5\' 5"  (1.651 m)  Wt 168 lb (76.204 kg)  BMI 27.96 kg/m2  SpO2 99%  LMP 07/05/2012 Physical Exam  Nursing note and vitals reviewed.  Constitutional: She is oriented to person, place, and time. She appears well-developed and well-nourished. No distress.  HENT:  Head: Normocephalic and atraumatic.  Neck: Normal range of motion. Neck supple.  Cardiovascular: Normal rate, regular rhythm, normal heart sounds and intact distal pulses.   No murmur heard. Pulmonary/Chest: Effort normal and breath sounds normal. No respiratory distress.  Musculoskeletal: She exhibits edema and tenderness.       Right ankle: She exhibits swelling. She exhibits normal range of motion, no ecchymosis, no deformity, no laceration and normal pulse. Tenderness. Lateral malleolus tenderness found. No posterior TFL, no head of 5th metatarsal and no proximal fibula tenderness found. Achilles tendon normal.       Feet:  Right lateral ankle ttp, mild STS is present.  ROM is preserved.  DP pulse is brisk,distal sensation intact.   No erythema, abrasion, bruising or bony deformity.  No proximal tenderness. Compartments are soft  Neurological: She is alert and oriented to person, place, and time. She exhibits normal muscle tone. Coordination normal.  Skin: Skin is warm and dry.    ED Course  Procedures (including critical care time) Labs Review Labs Reviewed - No data to display Imaging Review Dg Ankle Complete Right  10/29/2013   CLINICAL DATA:  Right ankle pain  EXAM: RIGHT ANKLE - COMPLETE 3+ VIEW  COMPARISON:  None.  FINDINGS: There is no evidence of fracture, dislocation, or joint effusion. There is no evidence of arthropathy or other focal bone abnormality. Soft tissues are unremarkable.  IMPRESSION: Negative.   Electronically Signed   By: Kathreen Devoid   On: 10/29/2013 17:39     EKG Interpretation None      MDM   Final diagnoses:  Right ankle sprain    X-ray results reviewed and discussed.  Injuries appear consistent with ankle sprain  ASO splint applied, pain improved, remains neurovascularly intact. Patient has her own crutches  She agrees to elevate ice and orthopedic followup in one week if symptoms are not improving. Vital signs are stable. Patient appears stable for discharge.     Zacharee Gaddie L. Vanessa Caroline, PA-C 10/29/13 1929

## 2013-10-29 NOTE — ED Notes (Signed)
Pt has her own crutches.  aso applied, sl swelling presennt

## 2013-10-29 NOTE — Discharge Instructions (Signed)
Ankle Sprain An ankle sprain is an injury to the strong, fibrous tissues (ligaments) that hold your ankle bones together.  HOME CARE   Put ice on your ankle for 1 2 days or as told by your doctor.  Put ice in a plastic bag.  Place a towel between your skin and the bag.  Leave the ice on for 15-20 minutes at a time, every 2 hours while you are awake.  Only take medicine as told by your doctor.  Raise (elevate) your injured ankle above the level of your heart as much as possible for 2 3 days.  Use crutches if your doctor tells you to. Slowly put your own weight on the affected ankle. Use the crutches until you can walk without pain.  If you have a plaster splint:  Do not rest it on anything harder than a pillow for 24 hours.  Do not put weight on it.  Do not get it wet.  Take it off to shower or bathe.  If given, use an elastic wrap or support stocking for support. Take the wrap off if your toes lose feeling (numb), tingle, or turn cold or blue.  If you have an air splint:  Add or let out air to make it comfortable.  Take it off at night and to shower and bathe.  Wiggle your toes and move your ankle up and down often while you are wearing it. GET HELP RIGHT AWAY IF:   Your toes lose feeling (numb) or turn blue.  You have severe pain that is increasing.  You have rapidly increasing bruising or puffiness (swelling).  Your toes feel very cold.  You lose feeling in your foot.  Your medicine does not help your pain. MAKE SURE YOU:   Understand these instructions.  Will watch your condition.  Will get help right away if you are not doing well or get worse. Document Released: 01/15/2008 Document Revised: 04/22/2012 Document Reviewed: 02/10/2012 [facility] Patient Information 2014 [place], [place].

## 2013-10-29 NOTE — ED Notes (Signed)
Pt c/o right ankle pain after falling this am

## 2013-10-29 NOTE — ED Provider Notes (Signed)
History/physical exam/procedure(s) were performed by non-physician practitioner and as supervising physician I was immediately available for consultation/collaboration. I have reviewed all notes and am in agreement with care and plan.   Shaune Pollack, MD 10/29/13 2241

## 2014-06-29 ENCOUNTER — Other Ambulatory Visit (HOSPITAL_COMMUNITY): Payer: Self-pay | Admitting: Internal Medicine

## 2014-06-29 DIAGNOSIS — R946 Abnormal results of thyroid function studies: Secondary | ICD-10-CM

## 2014-07-04 ENCOUNTER — Ambulatory Visit (HOSPITAL_COMMUNITY): Payer: Managed Care, Other (non HMO)

## 2014-07-05 ENCOUNTER — Ambulatory Visit (HOSPITAL_COMMUNITY)
Admission: RE | Admit: 2014-07-05 | Discharge: 2014-07-05 | Disposition: A | Discharge status: Home / Self Care | Payer: Managed Care, Other (non HMO) | Source: Ambulatory Visit | Attending: Internal Medicine | Admitting: Internal Medicine

## 2014-07-05 DIAGNOSIS — R946 Abnormal results of thyroid function studies: Secondary | ICD-10-CM | POA: Diagnosis present

## 2014-07-20 ENCOUNTER — Other Ambulatory Visit (HOSPITAL_COMMUNITY): Payer: Self-pay | Admitting: Internal Medicine

## 2014-07-20 DIAGNOSIS — Z1231 Encounter for screening mammogram for malignant neoplasm of breast: Secondary | ICD-10-CM

## 2014-08-18 ENCOUNTER — Ambulatory Visit (HOSPITAL_COMMUNITY): Payer: Managed Care, Other (non HMO)

## 2014-08-22 ENCOUNTER — Ambulatory Visit (HOSPITAL_COMMUNITY)
Admission: RE | Admit: 2014-08-22 | Discharge: 2014-08-22 | Disposition: A | Discharge status: Home / Self Care | Payer: Managed Care, Other (non HMO) | Source: Ambulatory Visit | Attending: Internal Medicine | Admitting: Internal Medicine

## 2014-08-22 DIAGNOSIS — Z1231 Encounter for screening mammogram for malignant neoplasm of breast: Secondary | ICD-10-CM

## 2014-10-13 ENCOUNTER — Other Ambulatory Visit (HOSPITAL_COMMUNITY): Payer: Self-pay | Admitting: "Endocrinology

## 2014-10-13 DIAGNOSIS — E0590 Thyrotoxicosis, unspecified without thyrotoxic crisis or storm: Secondary | ICD-10-CM

## 2014-10-14 ENCOUNTER — Other Ambulatory Visit (HOSPITAL_COMMUNITY): Payer: Self-pay | Admitting: Internal Medicine

## 2014-10-17 ENCOUNTER — Encounter (HOSPITAL_COMMUNITY): Payer: Self-pay

## 2014-10-17 ENCOUNTER — Encounter (HOSPITAL_COMMUNITY)
Admission: RE | Admit: 2014-10-17 | Discharge: 2014-10-17 | Disposition: A | Discharge status: Home / Self Care | Payer: Managed Care, Other (non HMO) | Source: Ambulatory Visit | Attending: "Endocrinology | Admitting: "Endocrinology

## 2014-10-17 DIAGNOSIS — E0590 Thyrotoxicosis, unspecified without thyrotoxic crisis or storm: Secondary | ICD-10-CM | POA: Insufficient documentation

## 2014-10-17 MED ORDER — SODIUM IODIDE I 131 CAPSULE
8.5000 | Freq: Once | INTRAVENOUS | Status: AC | PRN
  Administered 2014-10-17: 8.5 via ORAL

## 2014-10-18 ENCOUNTER — Encounter (HOSPITAL_COMMUNITY)
Admission: RE | Admit: 2014-10-18 | Discharge: 2014-10-18 | Disposition: A | Discharge status: Home / Self Care | Payer: Managed Care, Other (non HMO) | Source: Ambulatory Visit | Attending: "Endocrinology | Admitting: "Endocrinology

## 2014-10-18 DIAGNOSIS — E0590 Thyrotoxicosis, unspecified without thyrotoxic crisis or storm: Secondary | ICD-10-CM | POA: Diagnosis present

## 2014-10-18 MED ORDER — SODIUM PERTECHNETATE TC 99M INJECTION
10.0000 | Freq: Once | INTRAVENOUS | Status: AC | PRN
  Administered 2014-10-18: 11 via INTRAVENOUS

## 2015-05-31 ENCOUNTER — Ambulatory Visit: Payer: Self-pay | Admitting: "Endocrinology

## 2015-06-01 ENCOUNTER — Ambulatory Visit: Payer: Self-pay | Admitting: "Endocrinology

## 2015-06-26 ENCOUNTER — Encounter: Payer: Self-pay | Admitting: "Endocrinology

## 2015-06-26 ENCOUNTER — Ambulatory Visit (INDEPENDENT_AMBULATORY_CARE_PROVIDER_SITE_OTHER): Payer: Managed Care, Other (non HMO) | Admitting: "Endocrinology

## 2015-06-26 ENCOUNTER — Ambulatory Visit: Payer: Self-pay | Admitting: "Endocrinology

## 2015-06-26 VITALS — BP 140/93 | HR 77 | Ht 66.0 in | Wt 162.0 lb

## 2015-06-26 DIAGNOSIS — E0590 Thyrotoxicosis, unspecified without thyrotoxic crisis or storm: Secondary | ICD-10-CM | POA: Insufficient documentation

## 2015-06-26 DIAGNOSIS — E079 Disorder of thyroid, unspecified: Secondary | ICD-10-CM | POA: Diagnosis not present

## 2015-06-26 NOTE — Patient Instructions (Signed)
Visit in 3 months with another set of TSH and free T4.

## 2015-06-26 NOTE — Progress Notes (Signed)
Subjective:    Patient ID: Bethany Winters, female    DOB: 21-May-1961,    Past Medical History  Diagnosis Date  . GERD (gastroesophageal reflux disease)    Past Surgical History  Procedure Laterality Date  . Foot surgery      left  . Abdominal hysterectomy      partial   Social History   Social History  . Marital Status: Married    Spouse Name: N/A  . Number of Children: N/A  . Years of Education: N/A   Social History Main Topics  . Smoking status: Former Smoker -- 0.50 packs/day  . Smokeless tobacco: None  . Alcohol Use: No     Comment: weekends  . Drug Use: No  . Sexual Activity: Not Asked   Other Topics Concern  . None   Social History Narrative   Outpatient Encounter Prescriptions as of 06/26/2015  Medication Sig  . ranitidine (ZANTAC) 150 MG tablet Take 150 mg by mouth daily.  Marland Kitchen acetaminophen (TYLENOL) 500 MG tablet Take 1,000 mg by mouth every 4 (four) hours as needed for pain.  Marland Kitchen HYDROcodone-acetaminophen (NORCO/VICODIN) 5-325 MG per tablet Take one-two tabs po q 4-6 hrs prn pain  . HYDROcodone-acetaminophen (NORCO/VICODIN) 5-325 MG per tablet Take one-two tabs po q 4-6 hrs prn pain  . ibuprofen (ADVIL,MOTRIN) 800 MG tablet Take 1 tablet (800 mg total) by mouth 3 (three) times daily.  . naproxen (NAPROSYN) 500 MG tablet Take 1 tablet (500 mg total) by mouth 2 (two) times daily.   No facility-administered encounter medications on file as of 06/26/2015.   ALLERGIES: No Known Allergies VACCINATION STATUS:  There is no immunization history on file for this patient.  HPI  Bethany Winters is a 54- yr-old female patient with medical history as above. she is here to f/u for abnormal thyroid function test . she has no new complaints. her repeat TFts show improving TSH of 0.12 , free t4  Was not included in this test.  Her prior thyroid uptake and scan was unremarkable, at 29% ( normal at 10-30%). Pt denies family history of thyroid dysfunction. she denies  personal history of goiter. Pt is not on any anti-thyroid medications nor on any thyroid hormone supplements, nor she has any exposure to thyroid hormones.  Review of Systems Constitutional: no weight gain/loss, no fatigue, no subjective hyperthermia/hypothermia Eyes: no blurry vision, no xerophthalmia ENT: no sore throat, no nodules palpated in throat, no dysphagia/odynophagia, no hoarseness Cardiovascular: no CP/SOB/palpitations/leg swelling Respiratory: no cough/SOB Gastrointestinal: no N/V/D/C Musculoskeletal: no muscle/joint aches Skin: no rashes Neurological: no tremors/numbness/tingling/dizziness Psychiatric: no depression/anxiety   Objective:    BP 140/93 mmHg  Pulse 77  Ht 5\' 6"  (1.676 m)  Wt 162 lb (73.483 kg)  BMI 26.16 kg/m2  SpO2 95%  LMP 07/05/2012  Wt Readings from Last 3 Encounters:  06/26/15 162 lb (73.483 kg)  10/29/13 168 lb (76.204 kg)  01/20/13 138 lb (62.596 kg)    Physical Exam Constitutional: overweight, in NAD Eyes: PERRLA, EOMI, no exophthalmos ENT: moist mucous membranes, no thyromegaly, no cervical lymphadenopathy Cardiovascular: RRR, No MRG Respiratory: CTA B Gastrointestinal: abdomen soft, NT, ND, BS+ Musculoskeletal: no deformities, strength intact in all 4 Skin: moist, warm, no rashes Neurological: no tremor with outstretched hands, DTR normal in all 4   Results for orders placed or performed during the hospital encounter of 07/05/12  CBC  Result Value Ref Range   WBC 6.6 4.0 - 10.5 K/uL   RBC  4.61 3.87 - 5.11 MIL/uL   Hemoglobin 13.7 12.0 - 15.0 g/dL   HCT 40.6 36.0 - 46.0 %   MCV 88.1 78.0 - 100.0 fL   MCH 29.7 26.0 - 34.0 pg   MCHC 33.7 30.0 - 36.0 g/dL   RDW 13.6 11.5 - 15.5 %   Platelets 267 150 - 400 K/uL  Basic metabolic panel  Result Value Ref Range   Sodium 141 135 - 145 mEq/L   Potassium 3.5 3.5 - 5.1 mEq/L   Chloride 105 96 - 112 mEq/L   CO2 25 19 - 32 mEq/L   Glucose, Bld 117 (H) 70 - 99 mg/dL   BUN 8 6 - 23 mg/dL    Creatinine, Ser 0.59 0.50 - 1.10 mg/dL   Calcium 9.4 8.4 - 10.5 mg/dL   GFR calc non Af Amer >90 >90 mL/min   GFR calc Af Amer >90 >90 mL/min  Troponin I (MHP)  Result Value Ref Range   Troponin I <0.30 <0.30 ng/mL  Hepatic function panel  Result Value Ref Range   Total Protein 7.3 6.0 - 8.3 g/dL   Albumin 3.8 3.5 - 5.2 g/dL   AST 20 0 - 37 U/L   ALT 16 0 - 35 U/L   Alkaline Phosphatase 82 39 - 117 U/L   Total Bilirubin 0.1 (L) 0.3 - 1.2 mg/dL   Bilirubin, Direct <0.1 0.0 - 0.3 mg/dL   Indirect Bilirubin NOT CALCULATED 0.3 - 0.9 mg/dL  Lipase, blood  Result Value Ref Range   Lipase 41 11 - 59 U/L   Complete Blood Count (Most recent): Lab Results  Component Value Date   WBC 6.6 07/05/2012   HGB 13.7 07/05/2012   HCT 40.6 07/05/2012   MCV 88.1 07/05/2012   PLT 267 07/05/2012   Chemistry (most recent): Lab Results  Component Value Date   NA 141 07/05/2012   K 3.5 07/05/2012   CL 105 07/05/2012   CO2 25 07/05/2012   BUN 8 07/05/2012   CREATININE 0.59 07/05/2012   Diabetic Labs (most recent): No results found for: HGBA1C Lipid profile (most recent): No results found for: TRIG, CHOL       Assessment & Plan:   1. Disorder of thyroid gland  Pt's history and most recent labs are reviewed. Her repeat TFTs show that TSH improving to 0.12, still low and suggesting a possibility of hyperthyroidism.  I will obtain free t4 , she will be called with results.  her prior labs were negative for antithyroid antibodies. Her uptake was 29%, with no focal abnormality. Possible explanation is likely  subacute thyroiditis with transient thyrotoxicosis now in resolution.  She now has no sign of hyperthyroidism, she will not need intervention for now.  I will repeat TFTs in 12 weeks with OV.   I advised patient to maintain close follow up with their PCP for primary care needs. Follow up plan: Return in about 3 months (around 09/26/2015) for abnormal thyroid function, labs  today.  Glade Lloyd, MD Phone: [phone]  Fax: [phone]   06/26/2015, 6:25 PM

## 2015-06-27 LAB — TSH: TSH: 0.275 u[IU]/mL — ABNORMAL LOW (ref 0.350–4.500)

## 2015-06-27 LAB — T4, FREE: Free T4: 0.86 ng/dL (ref 0.80–1.80)

## 2015-07-25 ENCOUNTER — Other Ambulatory Visit (HOSPITAL_COMMUNITY): Payer: Self-pay | Admitting: Internal Medicine

## 2015-07-25 DIAGNOSIS — Z1231 Encounter for screening mammogram for malignant neoplasm of breast: Secondary | ICD-10-CM

## 2015-08-25 ENCOUNTER — Ambulatory Visit (HOSPITAL_COMMUNITY)
Admission: RE | Admit: 2015-08-25 | Discharge: 2015-08-25 | Disposition: A | Discharge status: Home / Self Care | Payer: BLUE CROSS/BLUE SHIELD | Source: Ambulatory Visit | Attending: Internal Medicine | Admitting: Internal Medicine

## 2015-08-25 DIAGNOSIS — Z1231 Encounter for screening mammogram for malignant neoplasm of breast: Secondary | ICD-10-CM | POA: Diagnosis present

## 2015-09-19 ENCOUNTER — Other Ambulatory Visit: Payer: Self-pay | Admitting: "Endocrinology

## 2015-09-20 LAB — T4, FREE: FREE T4: 0.8 ng/dL (ref 0.8–1.8)

## 2015-09-20 LAB — TSH: TSH: 0.72 m[IU]/L

## 2015-09-27 ENCOUNTER — Ambulatory Visit (INDEPENDENT_AMBULATORY_CARE_PROVIDER_SITE_OTHER): Payer: BLUE CROSS/BLUE SHIELD | Admitting: "Endocrinology

## 2015-09-27 ENCOUNTER — Encounter: Payer: Self-pay | Admitting: "Endocrinology

## 2015-09-27 VITALS — BP 117/83 | HR 72 | Ht 66.0 in | Wt 160.0 lb

## 2015-09-27 DIAGNOSIS — E0590 Thyrotoxicosis, unspecified without thyrotoxic crisis or storm: Secondary | ICD-10-CM | POA: Diagnosis not present

## 2015-09-27 NOTE — Progress Notes (Signed)
Subjective:    Patient ID: Bethany Winters, female    DOB: 1961/06/06,    Past Medical History  Diagnosis Date  . GERD (gastroesophageal reflux disease)    Past Surgical History  Procedure Laterality Date  . Foot surgery      left  . Abdominal hysterectomy      partial   Social History   Social History  . Marital Status: Married    Spouse Name: N/A  . Number of Children: N/A  . Years of Education: N/A   Social History Main Topics  . Smoking status: Former Smoker -- 0.50 packs/day  . Smokeless tobacco: None  . Alcohol Use: No     Comment: weekends  . Drug Use: No  . Sexual Activity: Not Asked   Other Topics Concern  . None   Social History Narrative   Outpatient Encounter Prescriptions as of 09/27/2015  Medication Sig  . ranitidine (ZANTAC) 150 MG tablet Take 150 mg by mouth daily.  . [DISCONTINUED] acetaminophen (TYLENOL) 500 MG tablet Take 1,000 mg by mouth every 4 (four) hours as needed for pain.  . [DISCONTINUED] HYDROcodone-acetaminophen (NORCO/VICODIN) 5-325 MG per tablet Take one-two tabs po q 4-6 hrs prn pain  . [DISCONTINUED] HYDROcodone-acetaminophen (NORCO/VICODIN) 5-325 MG per tablet Take one-two tabs po q 4-6 hrs prn pain  . [DISCONTINUED] ibuprofen (ADVIL,MOTRIN) 800 MG tablet Take 1 tablet (800 mg total) by mouth 3 (three) times daily.  . [DISCONTINUED] naproxen (NAPROSYN) 500 MG tablet Take 1 tablet (500 mg total) by mouth 2 (two) times daily.   No facility-administered encounter medications on file as of 09/27/2015.   ALLERGIES: No Known Allergies VACCINATION STATUS:  There is no immunization history on file for this patient.  HPI  Bethany Winters is a 55- yr-old female patient with medical history as above. she is here to f/u for abnormal thyroid function test . she has no new complaints. her repeat TFTs show improving  to within normal range.   Her prior thyroid uptake and scan was unremarkable, at 29% ( normal at 10-30%). Pt denies family  history of thyroid dysfunction. she denies personal history of goiter. Pt is not on any anti-thyroid medications nor on any thyroid hormone supplements, nor she has any exposure to thyroid hormones.  Review of Systems Constitutional: no weight gain/loss, no fatigue, no subjective hyperthermia/hypothermia Eyes: no blurry vision, no xerophthalmia ENT: no sore throat, no nodules palpated in throat, no dysphagia/odynophagia, no hoarseness Cardiovascular: no CP/SOB/palpitations/leg swelling Respiratory: no cough/SOB Gastrointestinal: no N/V/D/C Musculoskeletal: no muscle/joint aches Skin: no rashes Neurological: no tremors/numbness/tingling/dizziness Psychiatric: no depression/anxiety   Objective:    BP 117/83 mmHg  Pulse 72  Ht 5\' 6"  (1.676 m)  Wt 160 lb (72.576 kg)  BMI 25.84 kg/m2  SpO2 99%  LMP 07/05/2012  Wt Readings from Last 3 Encounters:  09/27/15 160 lb (72.576 kg)  06/26/15 162 lb (73.483 kg)  10/29/13 168 lb (76.204 kg)    Physical Exam Constitutional: overweight, in NAD Eyes: PERRLA, EOMI, no exophthalmos ENT: moist mucous membranes, no thyromegaly, no cervical lymphadenopathy Cardiovascular: RRR, No MRG Respiratory: CTA B Gastrointestinal: abdomen soft, NT, ND, BS+ Musculoskeletal: no deformities, strength intact in all 4 Skin: moist, warm, no rashes Neurological: no tremor with outstretched hands, DTR normal in all 4   Results for orders placed or performed in visit on 09/19/15  TSH  Result Value Ref Range   TSH 0.72 mIU/L  T4, free  Result Value Ref Range  Free T4 0.8 0.8 - 1.8 ng/dL   Complete Blood Count (Most recent): Lab Results  Component Value Date   WBC 6.6 07/05/2012   HGB 13.7 07/05/2012   HCT 40.6 07/05/2012   MCV 88.1 07/05/2012   PLT 267 07/05/2012   Chemistry (most recent): Lab Results  Component Value Date   NA 141 07/05/2012   K 3.5 07/05/2012   CL 105 07/05/2012   CO2 25 07/05/2012   BUN 8 07/05/2012   CREATININE 0.59  07/05/2012      Assessment & Plan:   1. Subclinical hyperthyroidism  Pt's history and most recent labs are reviewed. Her repeat TFTs show that TSH improving to 0.72, free T4 low normal at 0.8 .  her prior labs were negative for antithyroid antibodies. Her uptake was 29%, with no focal abnormality. Possible explanation is likely  subacute thyroiditis with transient thyrotoxicosis now in resolution.  She now has no sign of hyperthyroidism, she will not need intervention for now.  I will repeat TFTs in 6 month  with OV.   I advised patient to maintain close follow up with their PCP for primary care needs. Follow up plan: Return in about 6 months (around 03/26/2016) for follow up with pre-visit labs.  Glade Lloyd, MD Phone: [phone]  Fax: [phone]   09/27/2015, 4:27 PM

## 2016-03-29 ENCOUNTER — Other Ambulatory Visit: Payer: Self-pay | Admitting: "Endocrinology

## 2016-03-29 DIAGNOSIS — E039 Hypothyroidism, unspecified: Secondary | ICD-10-CM

## 2016-03-29 LAB — T4, FREE: FREE T4: 1 ng/dL (ref 0.8–1.8)

## 2016-03-29 LAB — TSH: TSH: 1.02 m[IU]/L

## 2016-04-08 ENCOUNTER — Ambulatory Visit (INDEPENDENT_AMBULATORY_CARE_PROVIDER_SITE_OTHER): Payer: BLUE CROSS/BLUE SHIELD | Admitting: "Endocrinology

## 2016-04-08 ENCOUNTER — Encounter: Payer: Self-pay | Admitting: "Endocrinology

## 2016-04-08 VITALS — BP 131/84 | HR 78 | Ht 66.0 in | Wt 160.0 lb

## 2016-04-08 DIAGNOSIS — E0590 Thyrotoxicosis, unspecified without thyrotoxic crisis or storm: Secondary | ICD-10-CM | POA: Diagnosis not present

## 2016-04-08 NOTE — Progress Notes (Signed)
Subjective:    Patient ID: Bethany Winters, female    DOB: [birth year],    Past Medical History:  Diagnosis Date  . GERD (gastroesophageal reflux disease)    Past Surgical History:  Procedure Laterality Date  . ABDOMINAL HYSTERECTOMY     partial  . FOOT SURGERY     left   Social History   Social History  . Marital status: Married    Spouse name: N/A  . Number of children: N/A  . Years of education: N/A   Social History Main Topics  . Smoking status: Former Smoker    Packs/day: 0.50  . Smokeless tobacco: Never Used  . Alcohol use No     Comment: weekends  . Drug use: No  . Sexual activity: Not Asked   Other Topics Concern  . None   Social History Narrative  . None   Outpatient Encounter Prescriptions as of 04/08/2016  Medication Sig  . ranitidine (ZANTAC) 150 MG tablet Take 150 mg by mouth daily.   No facility-administered encounter medications on file as of 04/08/2016.    ALLERGIES: No Known Allergies VACCINATION STATUS:  There is no immunization history on file for this patient.  HPI  Bethany Winters is a 55- yr-old female patient with medical history as above. she is here to f/u for abnormal thyroid function test . she has no new complaints. her repeat TFTs show improving  to within normal range.   Her prior thyroid uptake and scan was unremarkable, at 29% ( normal at 10-30%). Pt denies family history of thyroid dysfunction. she denies personal history of goiter. Pt is not on any anti-thyroid medications nor on any thyroid hormone supplements, nor she has any exposure to thyroid hormones.  Review of Systems Constitutional: no weight gain/loss, no fatigue, no subjective hyperthermia/hypothermia Eyes: no blurry vision, no xerophthalmia ENT: no sore throat, no nodules palpated in throat, no dysphagia/odynophagia, no hoarseness Cardiovascular: no CP/SOB/palpitations/leg swelling Respiratory: no cough/SOB Gastrointestinal: no N/V/D/C Musculoskeletal: no  muscle/joint aches Skin: no rashes Neurological: no tremors/numbness/tingling/dizziness Psychiatric: no depression/anxiety   Objective:    BP 131/84   Pulse 78   Ht 5\' 6"  (1.676 m)   Wt 160 lb (72.6 kg)   LMP 07/05/2012   BMI 25.82 kg/m   Wt Readings from Last 3 Encounters:  04/08/16 160 lb (72.6 kg)  09/27/15 160 lb (72.6 kg)  06/26/15 162 lb (73.5 kg)    Physical Exam Constitutional: overweight, in NAD Eyes: PERRLA, EOMI, no exophthalmos ENT: moist mucous membranes, no thyromegaly, no cervical lymphadenopathy Cardiovascular: RRR, No MRG Respiratory: CTA B Gastrointestinal: abdomen soft, NT, ND, BS+ Musculoskeletal: no deformities, strength intact in all 4 Skin: moist, warm, no rashes Neurological: no tremor with outstretched hands, DTR normal in all 4   Results for orders placed or performed in visit on 03/29/16  TSH  Result Value Ref Range   TSH 1.02 mIU/L  T4, Free  Result Value Ref Range   Free T4 1.0 0.8 - 1.8 ng/dL   Complete Blood Count (Most recent): Lab Results  Component Value Date   WBC 6.6 07/05/2012   HGB 13.7 07/05/2012   HCT 40.6 07/05/2012   MCV 88.1 07/05/2012   PLT 267 07/05/2012   Chemistry (most recent): Lab Results  Component Value Date   NA 141 07/05/2012   K 3.5 07/05/2012   CL 105 07/05/2012   CO2 25 07/05/2012   BUN 8 07/05/2012   CREATININE 0.59 07/05/2012  Assessment & Plan:   1. Subclinical hyperthyroidism  Pt's history and most recent labs are reviewed. Her repeat TFTs show that TSH improving to 1.02, free T4  normal at 1.0.  her prior labs were negative for antithyroid antibodies. Her uptake was 29%, with no focal abnormality. Possible explanation is likely  subacute thyroiditis with transient thyrotoxicosis now in resolution.  She now has no sign of hyperthyroidism, she will not need intervention for now.  I will repeat TFTs in 12 month  with OV.   I advised patient to maintain close follow up with their  PCP for primary care needs. Follow up plan: Return in about 1 year (around 04/08/2017) for follow up with pre-visit labs.  Glade Lloyd, MD Phone: [phone]  Fax: [phone]   04/08/2016, 9:09 AM

## 2016-06-17 ENCOUNTER — Encounter (HOSPITAL_COMMUNITY): Payer: Self-pay | Admitting: *Deleted

## 2016-06-17 ENCOUNTER — Emergency Department (HOSPITAL_COMMUNITY)
Admission: EM | Admit: 2016-06-17 | Discharge: 2016-06-17 | Disposition: A | Discharge status: Home / Self Care | Payer: BLUE CROSS/BLUE SHIELD | Attending: Emergency Medicine | Admitting: Emergency Medicine

## 2016-06-17 DIAGNOSIS — Z87891 Personal history of nicotine dependence: Secondary | ICD-10-CM | POA: Insufficient documentation

## 2016-06-17 DIAGNOSIS — J029 Acute pharyngitis, unspecified: Secondary | ICD-10-CM | POA: Diagnosis present

## 2016-06-17 DIAGNOSIS — J020 Streptococcal pharyngitis: Secondary | ICD-10-CM

## 2016-06-17 DIAGNOSIS — Z79899 Other long term (current) drug therapy: Secondary | ICD-10-CM | POA: Insufficient documentation

## 2016-06-17 DIAGNOSIS — R5383 Other fatigue: Secondary | ICD-10-CM | POA: Insufficient documentation

## 2016-06-17 LAB — RAPID STREP SCREEN (MED CTR MEBANE ONLY): Streptococcus, Group A Screen (Direct): POSITIVE — AB

## 2016-06-17 MED ORDER — DEXAMETHASONE SODIUM PHOSPHATE 10 MG/ML IJ SOLN
10.0000 mg | Freq: Once | INTRAMUSCULAR | Status: AC
  Administered 2016-06-17: 10 mg via INTRAMUSCULAR
  Filled 2016-06-17: qty 1

## 2016-06-17 MED ORDER — PENICILLIN G BENZATHINE 1200000 UNIT/2ML IM SUSP
1.2000 10*6.[IU] | Freq: Once | INTRAMUSCULAR | Status: AC
  Administered 2016-06-17: 1.2 10*6.[IU] via INTRAMUSCULAR
  Filled 2016-06-17: qty 2

## 2016-06-17 NOTE — ED Triage Notes (Signed)
Pt c/o sore throat, chills, reports " I am just not feeling well"

## 2016-06-17 NOTE — ED Provider Notes (Signed)
[facility] DEPT Provider Note   CSN: [id number] Arrival date & time: 06/17/16  [id number]     History   Chief Complaint Chief Complaint  Patient presents with  . Sore Throat    HPI Bethany Winters is a 55 y.o. female.  Complains of 2 days of sore throat, malaise, generalized muscle aches. She has had nausea but no vomiting. No diarrhea or abdominal pain. Denies urinary symptoms. She has not had any cough or sinus pressure. Reports having to leave work tonight because of feeling poorly.      Past Medical History:  Diagnosis Date  . GERD (gastroesophageal reflux disease)     Patient Active Problem List   Diagnosis Date Noted  . Subclinical hyperthyroidism 06/26/2015  . OTHER CLOSED FRACTURE OF TARSAL&METATARSAL BONES 07/02/2010    Past Surgical History:  Procedure Laterality Date  . ABDOMINAL HYSTERECTOMY     partial  . FOOT SURGERY     left    OB History    No data available       Home Medications    Prior to Admission medications   Medication Sig Start Date End Date Taking? Authorizing Provider  famotidine (PEPCID) 20 MG tablet Take 20 mg by mouth daily.   Yes Historical Provider, MD  ranitidine (ZANTAC) 150 MG tablet Take 150 mg by mouth daily.    Historical Provider, MD    Family History Family History  Problem Relation Age of Onset  . Hypertension Mother     Social History Social History  Substance Use Topics  . Smoking status: Former Smoker    Packs/day: 0.50  . Smokeless tobacco: Never Used  . Alcohol use No     Comment: weekends     Allergies   Patient has no known allergies.   Review of Systems Review of Systems  Constitutional: Positive for fatigue.  HENT: Positive for sore throat.   Musculoskeletal: Positive for myalgias.  All other systems reviewed and are negative.    Physical Exam Updated Vital Signs BP 126/91   Pulse 94   Temp 98.5 F (36.9 C) (Oral)   Resp 16   Ht 5\' 6"  (1.676 m)   Wt 162 lb (73.5 kg)   LMP  07/05/2012   SpO2 99%   BMI 26.15 kg/m   Physical Exam  Constitutional: She is oriented to person, place, and time. She appears well-developed and well-nourished. No distress.  HENT:  Head: Normocephalic and atraumatic.  Right Ear: Hearing normal.  Left Ear: Hearing normal.  Nose: Nose normal.  Mouth/Throat: Mucous membranes are normal. Posterior oropharyngeal erythema present. No oropharyngeal exudate or tonsillar abscesses.  Eyes: Conjunctivae and EOM are normal. Pupils are equal, round, and reactive to light.  Neck: Normal range of motion. Neck supple.  Cardiovascular: Regular rhythm, S1 normal and S2 normal.  Exam reveals no gallop and no friction rub.   No murmur heard. Pulmonary/Chest: Effort normal and breath sounds normal. No respiratory distress. She exhibits no tenderness.  Abdominal: Soft. Normal appearance and bowel sounds are normal. There is no hepatosplenomegaly. There is no tenderness. There is no rebound, no guarding, no tenderness at McBurney's point and negative Murphy's sign. No hernia.  Musculoskeletal: Normal range of motion.  Neurological: She is alert and oriented to person, place, and time. She has normal strength. No cranial nerve deficit or sensory deficit. Coordination normal. GCS eye subscore is 4. GCS verbal subscore is 5. GCS motor subscore is 6.  Skin: Skin is warm, dry and intact.  No rash noted. No cyanosis.  Psychiatric: She has a normal mood and affect. Her speech is normal and behavior is normal. Thought content normal.  Nursing note and vitals reviewed.    ED Treatments / Results  Labs (all labs ordered are listed, but only abnormal results are displayed) Labs Reviewed  RAPID STREP SCREEN (NOT AT [facility]) - Abnormal; Notable for the following:       Result Value   Streptococcus, Group A Screen (Direct) POSITIVE (*)    All other components within normal limits    EKG  EKG Interpretation None       Radiology No results  found.  Procedures Procedures (including critical care time)  Medications Ordered in ED Medications  penicillin g benzathine (BICILLIN LA) 1200000 UNIT/2ML injection 1.2 Million Units (1.2 Million Units Intramuscular Given 06/17/16 0408)  dexamethasone (DECADRON) injection 10 mg (10 mg Intramuscular Given 06/17/16 0408)     Initial Impression / Assessment and Plan / ED Course  I have reviewed the triage vital signs and the nursing notes.  Pertinent labs & imaging results that were available during my care of the patient were reviewed by me and considered in my medical decision making (see chart for details).  Clinical Course     Patient with sore throat, rapid strep positive. Treated with Decadron and Bicillin LA.  Final Clinical Impressions(s) / ED Diagnoses   Final diagnoses:  Strep throat    New Prescriptions Discharge Medication List as of 06/17/2016  4:00 AM       Orpah Greek, MD 06/18/16 [id number]

## 2016-07-23 ENCOUNTER — Other Ambulatory Visit (HOSPITAL_COMMUNITY): Payer: Self-pay | Admitting: Internal Medicine

## 2016-07-23 DIAGNOSIS — Z1231 Encounter for screening mammogram for malignant neoplasm of breast: Secondary | ICD-10-CM

## 2016-08-26 ENCOUNTER — Ambulatory Visit (HOSPITAL_COMMUNITY): Payer: Managed Care, Other (non HMO)

## 2016-09-05 ENCOUNTER — Ambulatory Visit (HOSPITAL_COMMUNITY)
Admission: RE | Admit: 2016-09-05 | Discharge: 2016-09-05 | Disposition: A | Discharge status: Home / Self Care | Payer: BLUE CROSS/BLUE SHIELD | Source: Ambulatory Visit | Attending: Internal Medicine | Admitting: Internal Medicine

## 2016-09-05 DIAGNOSIS — Z1231 Encounter for screening mammogram for malignant neoplasm of breast: Secondary | ICD-10-CM

## 2017-04-10 ENCOUNTER — Other Ambulatory Visit: Payer: Self-pay | Admitting: "Endocrinology

## 2017-04-10 DIAGNOSIS — E0590 Thyrotoxicosis, unspecified without thyrotoxic crisis or storm: Secondary | ICD-10-CM

## 2017-04-11 ENCOUNTER — Ambulatory Visit: Payer: BLUE CROSS/BLUE SHIELD | Admitting: "Endocrinology

## 2017-04-28 DIAGNOSIS — E0590 Thyrotoxicosis, unspecified without thyrotoxic crisis or storm: Secondary | ICD-10-CM | POA: Diagnosis not present

## 2017-04-28 LAB — TSH: TSH: 0.57 mIU/L

## 2017-04-28 LAB — T4, FREE: Free T4: 1 ng/dL (ref 0.8–1.8)

## 2017-05-09 ENCOUNTER — Encounter (INDEPENDENT_AMBULATORY_CARE_PROVIDER_SITE_OTHER): Payer: Self-pay

## 2017-05-09 ENCOUNTER — Encounter: Payer: Self-pay | Admitting: "Endocrinology

## 2017-05-09 ENCOUNTER — Ambulatory Visit (INDEPENDENT_AMBULATORY_CARE_PROVIDER_SITE_OTHER): Payer: BLUE CROSS/BLUE SHIELD | Admitting: "Endocrinology

## 2017-05-09 VITALS — BP 131/84 | HR 56 | Ht 66.0 in | Wt 164.0 lb

## 2017-05-09 DIAGNOSIS — E0590 Thyrotoxicosis, unspecified without thyrotoxic crisis or storm: Secondary | ICD-10-CM

## 2017-05-09 NOTE — Progress Notes (Signed)
Subjective:    Patient ID: Bethany Winters, female    DOB: 04/21/1961,    Past Medical History:  Diagnosis Date  . GERD (gastroesophageal reflux disease)    Past Surgical History:  Procedure Laterality Date  . ABDOMINAL HYSTERECTOMY     partial  . FOOT SURGERY     left   Social History   Social History  . Marital status: Married    Spouse name: N/A  . Number of children: N/A  . Years of education: N/A   Social History Main Topics  . Smoking status: Former Smoker    Packs/day: 0.50  . Smokeless tobacco: Never Used  . Alcohol use No     Comment: weekends  . Drug use: No  . Sexual activity: Not Asked   Other Topics Concern  . None   Social History Narrative  . None   Outpatient Encounter Prescriptions as of 05/09/2017  Medication Sig  . famotidine (PEPCID) 20 MG tablet Take 20 mg by mouth daily.  . ranitidine (ZANTAC) 150 MG tablet Take 150 mg by mouth daily.   No facility-administered encounter medications on file as of 05/09/2017.    ALLERGIES: No Known Allergies VACCINATION STATUS:  There is no immunization history on file for this patient.  HPI  Bethany Winters is a 56- yr-old female patient with medical history as above. she is here to f/u for abnormal thyroid function test . she has no new complaints. her repeat TFTs show improving  to within normal range.   Her prior thyroid uptake and scan was unremarkable, at 29% ( normal at 10-30%). Pt denies family history of thyroid dysfunction. she denies personal history of goiter. Pt is not on any anti-thyroid medications nor on any thyroid hormone supplements, nor she has any exposure to thyroid hormones.  Review of Systems Constitutional: + steady weight, no fatigue, no subjective hyperthermia/hypothermia Eyes: no blurry vision, no xerophthalmia ENT: no sore throat, no nodules palpated in throat, no dysphagia/odynophagia, no hoarseness Cardiovascular: no CP/SOB/palpitations/leg swelling Respiratory: no  cough/SOB Gastrointestinal: no N/V/D/C Musculoskeletal: no muscle/joint aches Skin: no rashes Neurological: no tremors/numbness/tingling/dizziness Psychiatric: no depression/anxiety   Objective:    BP 131/84   Pulse (!) 56   Ht 5\' 6"  (1.676 m)   Wt 164 lb (74.4 kg)   LMP 07/05/2012   BMI 26.47 kg/m   Wt Readings from Last 3 Encounters:  05/09/17 164 lb (74.4 kg)  06/17/16 162 lb (73.5 kg)  04/08/16 160 lb (72.6 kg)    Physical Exam Constitutional: overweight, in NAD Eyes: PERRLA, EOMI, no exophthalmos ENT: moist mucous membranes, no thyromegaly, no cervical lymphadenopathy Cardiovascular: RRR, No MRG Respiratory: CTA B Gastrointestinal: abdomen soft, NT, ND, BS+ Musculoskeletal: no deformities, strength intact in all 4 Skin: moist, warm, no rashes Neurological: no tremor with outstretched hands, DTR normal in all 4   Results for orders placed or performed in visit on 04/10/17  TSH  Result Value Ref Range   TSH 0.57 mIU/L  T4, Free  Result Value Ref Range   Free T4 1.0 0.8 - 1.8 ng/dL   Complete Blood Count (Most recent): Lab Results  Component Value Date   WBC 6.6 07/05/2012   HGB 13.7 07/05/2012   HCT 40.6 07/05/2012   MCV 88.1 07/05/2012   PLT 267 07/05/2012   Chemistry (most recent): Lab Results  Component Value Date   NA 141 07/05/2012   K 3.5 07/05/2012   CL 105 07/05/2012   CO2 25 07/05/2012  BUN 8 07/05/2012   CREATININE 0.59 07/05/2012      Assessment & Plan:   1. Subclinical hyperthyroidism -Her presenting thyroid function tests are within normal limits. her prior labs were negative for antithyroid antibodies. Her uptake was 29%, with no focal abnormality. Possible explanation is likely   subacute thyroiditis with transient thyrotoxicosis now in resolution. -  she will not need intervention for now.  I advised patient to maintain close follow up with their PCP for primary care needs. Follow up plan: Return in about 1 year (around  05/09/2018) for follow up with pre-visit labs, Thyroid / Neck Ultrasound.  Glade Lloyd, MD Phone: [phone]  Fax: [phone]   05/09/2017, 12:29 PM

## 2017-07-25 ENCOUNTER — Other Ambulatory Visit (HOSPITAL_COMMUNITY): Payer: Self-pay | Admitting: Internal Medicine

## 2017-07-25 DIAGNOSIS — Z1231 Encounter for screening mammogram for malignant neoplasm of breast: Secondary | ICD-10-CM

## 2017-09-08 ENCOUNTER — Ambulatory Visit (HOSPITAL_COMMUNITY)
Admission: RE | Admit: 2017-09-08 | Discharge: 2017-09-08 | Disposition: A | Discharge status: Home / Self Care | Payer: BLUE CROSS/BLUE SHIELD | Source: Ambulatory Visit | Attending: Internal Medicine | Admitting: Internal Medicine

## 2017-09-08 DIAGNOSIS — Z1231 Encounter for screening mammogram for malignant neoplasm of breast: Secondary | ICD-10-CM | POA: Diagnosis not present

## 2018-08-17 ENCOUNTER — Other Ambulatory Visit (HOSPITAL_COMMUNITY): Payer: Self-pay | Admitting: Internal Medicine

## 2018-08-17 DIAGNOSIS — Z1231 Encounter for screening mammogram for malignant neoplasm of breast: Secondary | ICD-10-CM

## 2018-09-14 ENCOUNTER — Ambulatory Visit (HOSPITAL_COMMUNITY)
Admission: RE | Admit: 2018-09-14 | Discharge: 2018-09-14 | Disposition: A | Discharge status: Home / Self Care | Payer: BLUE CROSS/BLUE SHIELD | Source: Ambulatory Visit | Attending: Internal Medicine | Admitting: Internal Medicine

## 2018-09-14 DIAGNOSIS — Z1231 Encounter for screening mammogram for malignant neoplasm of breast: Secondary | ICD-10-CM | POA: Diagnosis not present

## 2019-08-09 ENCOUNTER — Other Ambulatory Visit (HOSPITAL_COMMUNITY): Payer: Self-pay | Admitting: Internal Medicine

## 2019-08-09 DIAGNOSIS — Z1231 Encounter for screening mammogram for malignant neoplasm of breast: Secondary | ICD-10-CM

## 2019-09-22 ENCOUNTER — Ambulatory Visit (HOSPITAL_COMMUNITY): Payer: BLUE CROSS/BLUE SHIELD

## 2019-09-23 ENCOUNTER — Other Ambulatory Visit: Payer: Self-pay

## 2019-09-23 ENCOUNTER — Ambulatory Visit (HOSPITAL_COMMUNITY)
Admission: RE | Admit: 2019-09-23 | Discharge: 2019-09-23 | Disposition: A | Discharge status: Home / Self Care | Payer: BC Managed Care – PPO | Source: Ambulatory Visit | Attending: Internal Medicine | Admitting: Internal Medicine

## 2019-09-23 DIAGNOSIS — Z1231 Encounter for screening mammogram for malignant neoplasm of breast: Secondary | ICD-10-CM | POA: Diagnosis present

## 2020-08-17 ENCOUNTER — Other Ambulatory Visit: Payer: BC Managed Care – PPO

## 2020-08-17 DIAGNOSIS — Z20822 Contact with and (suspected) exposure to covid-19: Secondary | ICD-10-CM

## 2020-08-18 ENCOUNTER — Other Ambulatory Visit (HOSPITAL_COMMUNITY): Payer: Self-pay | Admitting: Internal Medicine

## 2020-08-18 DIAGNOSIS — Z1231 Encounter for screening mammogram for malignant neoplasm of breast: Secondary | ICD-10-CM

## 2020-08-19 LAB — NOVEL CORONAVIRUS, NAA: SARS-CoV-2, NAA: NOT DETECTED

## 2020-08-19 LAB — SARS-COV-2, NAA 2 DAY TAT

## 2020-09-25 ENCOUNTER — Ambulatory Visit (HOSPITAL_COMMUNITY): Payer: BC Managed Care – PPO

## 2020-09-25 ENCOUNTER — Ambulatory Visit (HOSPITAL_COMMUNITY)
Admission: RE | Admit: 2020-09-25 | Discharge: 2020-09-25 | Disposition: A | Discharge status: Home / Self Care | Payer: BC Managed Care – PPO | Source: Ambulatory Visit | Attending: Internal Medicine | Admitting: Internal Medicine

## 2020-09-25 ENCOUNTER — Other Ambulatory Visit: Payer: Self-pay

## 2020-09-25 DIAGNOSIS — Z1231 Encounter for screening mammogram for malignant neoplasm of breast: Secondary | ICD-10-CM | POA: Insufficient documentation

## 2021-05-02 ENCOUNTER — Encounter: Payer: Self-pay | Admitting: *Deleted

## 2021-06-05 ENCOUNTER — Ambulatory Visit: Payer: BC Managed Care – PPO

## 2021-07-02 ENCOUNTER — Other Ambulatory Visit: Payer: Self-pay

## 2021-07-02 ENCOUNTER — Encounter: Payer: Self-pay | Admitting: *Deleted

## 2021-07-02 ENCOUNTER — Ambulatory Visit (INDEPENDENT_AMBULATORY_CARE_PROVIDER_SITE_OTHER): Payer: Self-pay | Admitting: *Deleted

## 2021-07-02 VITALS — Ht 66.0 in | Wt 172.6 lb

## 2021-07-02 DIAGNOSIS — Z1211 Encounter for screening for malignant neoplasm of colon: Secondary | ICD-10-CM

## 2021-07-02 MED ORDER — CLENPIQ 10-3.5-12 MG-GM -GM/160ML PO SOLN: 1.0000 | Freq: Once | ORAL | 0 refills | Status: AC

## 2021-07-02 NOTE — Progress Notes (Signed)
Ok to schedule.  ASA II 

## 2021-07-02 NOTE — Progress Notes (Addendum)
Gastroenterology Pre-Procedure Review  Request Date: 07/02/2021 Requesting Physician: Ferdinand Lango, Rexford Maus at [facility] [place], no previous TCS  PATIENT REVIEW QUESTIONS: The patient responded to the following health history questions as indicated:    1. Diabetes Melitis: no 2. Joint replacements in the past 12 months: no 3. Major health problems in the past 3 months: no 4. Has an artificial valve or MVP: no 5. Has a defibrillator: no 6. Has been advised in past to take antibiotics in advance of a procedure like teeth cleaning: no 7. Family history of colon cancer: no  8. Alcohol Use: yes, 6 pack on weekends 9. Illicit drug Use: no 10. History of sleep apnea: no  11. History of coronary artery or other vascular stents placed within the last 12 months: no 12. History of any prior anesthesia complications: no 13. Body mass index is 27.86 kg/m.    MEDICATIONS & ALLERGIES:    Patient reports the following regarding taking any blood thinners:   Plavix? no Aspirin? no Coumadin? no Brilinta? no Xarelto? no Eliquis? no Pradaxa? no Savaysa? no Effient? no  Patient confirms/reports the following medications:  Current Outpatient Medications  Medication Sig Dispense Refill   Cholecalciferol (VITAMIN D3) 1.25 MG (50000 UT) CAPS Take by mouth once a week.     famotidine (PEPCID) 20 MG tablet Take 20 mg by mouth daily.     Multiple Vitamins-Minerals (EQ MULTIVITAMINS ADULT GUMMY) CHEW Chew by mouth. Chews 2 gummies a day.     No current facility-administered medications for this visit.    Patient confirms/reports the following allergies:  No Known Allergies  No orders of the defined types were placed in this encounter.   AUTHORIZATION INFORMATION Primary Insurance: [employer] ,  [place] #: [id number],  Group #: [id number] Pre-Cert / Josem Kaufmann required: No, not required per Bernestine Amass / Auth #: REF#: Meda Coffee [id number]  SCHEDULE INFORMATION: Procedure has been scheduled as follows:  Date:  08/07/2021, Time: 8:30  Location: [facility] with Dr. Abbey Chatters  This Gastroenterology Pre-Precedure Review Form is being routed to the following provider(s): Neil Crouch, PA-C

## 2021-07-03 ENCOUNTER — Other Ambulatory Visit: Payer: Self-pay | Admitting: *Deleted

## 2021-07-09 ENCOUNTER — Telehealth: Payer: Self-pay | Admitting: *Deleted

## 2021-07-09 ENCOUNTER — Encounter: Payer: Self-pay | Admitting: *Deleted

## 2021-07-09 NOTE — Telephone Encounter (Signed)
Patient called in requesting to speak with Angie regarding TCS she is scheduled for

## 2021-07-09 NOTE — Telephone Encounter (Signed)
Spoke to pt.  She requested to reschedule her procedure (08/07/2021).  She said that her husband has a doctor's appointment that day.  She rescheduled her procedure to 07/31/2021 at 1:15 with arrival at 11:45 am.  She was made aware that I will mail out new prep instructions.  Confirmed mailing address with pt.  [facility] for [place] in [facility].

## 2021-07-31 ENCOUNTER — Ambulatory Visit (HOSPITAL_COMMUNITY): Payer: BC Managed Care – PPO | Admitting: Anesthesiology

## 2021-07-31 ENCOUNTER — Ambulatory Visit (HOSPITAL_COMMUNITY)
Admission: RE | Admit: 2021-07-31 | Discharge: 2021-07-31 | Disposition: A | Discharge status: Home / Self Care | Payer: BC Managed Care – PPO | Attending: Internal Medicine | Admitting: Internal Medicine

## 2021-07-31 ENCOUNTER — Other Ambulatory Visit: Payer: Self-pay

## 2021-07-31 ENCOUNTER — Encounter (HOSPITAL_COMMUNITY): Payer: Self-pay

## 2021-07-31 ENCOUNTER — Encounter (HOSPITAL_COMMUNITY)
Admission: RE | Disposition: A | Discharge status: Home / Self Care | Payer: Self-pay | Source: Home / Self Care | Attending: Internal Medicine

## 2021-07-31 DIAGNOSIS — D125 Benign neoplasm of sigmoid colon: Secondary | ICD-10-CM | POA: Diagnosis not present

## 2021-07-31 DIAGNOSIS — Z1211 Encounter for screening for malignant neoplasm of colon: Secondary | ICD-10-CM | POA: Insufficient documentation

## 2021-07-31 DIAGNOSIS — K648 Other hemorrhoids: Secondary | ICD-10-CM | POA: Insufficient documentation

## 2021-07-31 DIAGNOSIS — D122 Benign neoplasm of ascending colon: Secondary | ICD-10-CM | POA: Insufficient documentation

## 2021-07-31 DIAGNOSIS — Z87891 Personal history of nicotine dependence: Secondary | ICD-10-CM | POA: Insufficient documentation

## 2021-07-31 DIAGNOSIS — D123 Benign neoplasm of transverse colon: Secondary | ICD-10-CM

## 2021-07-31 HISTORY — PX: BIOPSY: SHX5522

## 2021-07-31 HISTORY — PX: COLONOSCOPY WITH PROPOFOL: SHX5780

## 2021-07-31 SURGERY — COLONOSCOPY WITH PROPOFOL
Anesthesia: General

## 2021-07-31 MED ORDER — LACTATED RINGERS IV SOLN: INTRAVENOUS | Status: DC

## 2021-07-31 MED ORDER — PROPOFOL 10 MG/ML IV BOLUS
INTRAVENOUS | Status: DC | PRN
  Administered 2021-07-31: 100 mg via INTRAVENOUS

## 2021-07-31 MED ORDER — PROPOFOL 500 MG/50ML IV EMUL
INTRAVENOUS | Status: DC | PRN
Start: 2021-07-31 — End: 2021-07-31
  Administered 2021-07-31: 150 ug/kg/min via INTRAVENOUS

## 2021-07-31 NOTE — Transfer of Care (Signed)
Immediate Anesthesia Transfer of Care Note  Patient: Bethany Winters  Procedure(s) Performed: COLONOSCOPY WITH PROPOFOL BIOPSY  Patient Location: Endoscopy Unit  Anesthesia Type:General  Level of Consciousness: drowsy  Airway & Oxygen Therapy: Patient Spontanous Breathing  Post-op Assessment: Report given to RN and Post -op Vital signs reviewed and stable  Post vital signs: Reviewed and stable  Last Vitals:  Vitals Value Taken Time  BP    Temp    Pulse    Resp    SpO2      Last Pain:  Vitals:   07/31/21 1253  TempSrc:   PainSc: 0-No pain      Patients Stated Pain Goal: 8 (22/63/33 5456)  Complications: No notable events documented.

## 2021-07-31 NOTE — Anesthesia Postprocedure Evaluation (Signed)
Anesthesia Post Note  Patient: Bethany Winters  Procedure(s) Performed: COLONOSCOPY WITH PROPOFOL BIOPSY  Patient location during evaluation: PACU Anesthesia Type: General Level of consciousness: awake and alert and oriented Pain management: pain level controlled Vital Signs Assessment: post-procedure vital signs reviewed and stable Respiratory status: spontaneous breathing, nonlabored ventilation and respiratory function stable Cardiovascular status: blood pressure returned to baseline and stable Postop Assessment: no apparent nausea or vomiting Anesthetic complications: no   No notable events documented.   Last Vitals:  Vitals:   07/31/21 1139 07/31/21 1307  BP: (!) 157/101 100/66  Pulse: 82   Resp: 20 20  Temp: 36.9 C 36.4 C  SpO2: 100% 96%    Last Pain:  Vitals:   07/31/21 1307  TempSrc: Oral  PainSc: 0-No pain                 Macaria Bias C Shona Pardo

## 2021-07-31 NOTE — Op Note (Signed)
[facility] Patient Name: Bethany Winters Procedure Date: 07/31/2021 12:33 PM MRN: [id number] Date of Birth: June 17, 1961 Attending MD: Elon Alas. Edgar Frisk CSN: [id number] Age: 60 Admit Type: Outpatient Procedure:                Colonoscopy Indications:              Screening for colorectal malignant neoplasm Providers:                Elon Alas. Abbey Chatters, DO, Janeece Riggers, RN, Aram Candela Referring MD:              Medicines:                See the Anesthesia note for documentation of the                            administered medications Complications:            No immediate complications. Estimated Blood Loss:     Estimated blood loss was minimal. Procedure:                Pre-Anesthesia Assessment:                           - The anesthesia plan was to use monitored                            anesthesia care (MAC).                           After obtaining informed consent, the colonoscope                            was passed under direct vision. Throughout the                            procedure, the patient's blood pressure, pulse, and                            oxygen saturations were monitored continuously. The                            [id number]-HQ190L ([id number]) scope was introduced through                            the anus and advanced to the the cecum, identified                            by appendiceal orifice and ileocecal valve. The                            colonoscopy was performed without difficulty. The                            patient tolerated the procedure well. The quality  of the bowel preparation was evaluated using the                            BBPS [facility] Bowel Preparation Scale) with scores                            of: Right Colon = 2 (minor amount of residual                            staining, small fragments of stool and/or opaque                            liquid, but mucosa seen well), Transverse Colon = 3                             (entire mucosa seen well with no residual staining,                            small fragments of stool or opaque liquid) and Left                            Colon = 3 (entire mucosa seen well with no residual                            staining, small fragments of stool or opaque                            liquid). The total BBPS score equals 8. The quality                            of the bowel preparation was good. Scope In: 12:54:05 PM Scope Out: 1:04:52 PM Scope Withdrawal Time: 0 hours 9 minutes 16 seconds  Total Procedure Duration: 0 hours 10 minutes 47 seconds  Findings:      The perianal and digital rectal examinations were normal.      Non-bleeding internal hemorrhoids were found during endoscopy.      Three sessile polyps were found in the transverse colon and ascending       colon. The polyps were 1 to 2 mm in size. These polyps were removed with       a cold biopsy forceps. Resection and retrieval were complete.      Two sessile polyps were found in the sigmoid colon. The polyps were 2 mm       in size. These polyps were removed with a cold biopsy forceps. Resection       and retrieval were complete.      The exam was otherwise without abnormality. Impression:               - Non-bleeding internal hemorrhoids.                           - Three 1 to 2 mm polyps in the transverse colon  and in the ascending colon, removed with a cold                            biopsy forceps. Resected and retrieved.                           - Two 2 mm polyps in the sigmoid colon, removed                            with a cold biopsy forceps. Resected and retrieved.                           - The examination was otherwise normal. Moderate Sedation:      Per Anesthesia Care Recommendation:           - Patient has a contact number available for                            emergencies. The signs and symptoms of potential                             delayed complications were discussed with the                            patient. Return to normal activities tomorrow.                            Written discharge instructions were provided to the                            patient.                           - Resume previous diet.                           - Continue present medications.                           - Await pathology results.                           - Repeat colonoscopy in 5 years for surveillance.                           - Return to GI clinic PRN. Procedure Code(s):        --- Professional ---                           [id number], Colonoscopy, flexible; with biopsy, single                            or multiple Diagnosis Code(s):        --- Professional ---  Z12.11, Encounter for screening for malignant                            neoplasm of colon                           K63.5, Polyp of colon                           K64.8, Other hemorrhoids CPT copyright 2019 American Medical Association. All rights reserved. The codes documented in this report are preliminary and upon coder review may  be revised to meet current compliance requirements. Elon Alas. Abbey Chatters, DO [facility] Abbey Chatters, DO 07/31/2021 1:09:14 PM This report has been signed electronically. Number of Addenda: 0

## 2021-07-31 NOTE — H&P (Signed)
Primary Care Physician:  Alanson Puls, [facility] Primary Gastroenterologist:  Dr. Abbey Chatters  Pre-Procedure History & Physical: HPI:  Bethany Winters is a 60 y.o. female is here for a colonoscopy for colon cancer screening purposes.  Patient denies any family history of colorectal cancer.  No melena or hematochezia.  No abdominal pain or unintentional weight loss.  No change in bowel habits.  Overall feels well from a GI standpoint.  Past Medical History:  Diagnosis Date   GERD (gastroesophageal reflux disease)     Past Surgical History:  Procedure Laterality Date   ABDOMINAL HYSTERECTOMY     partial   FOOT SURGERY     left    Prior to Admission medications   Medication Sig Start Date End Date Taking? Authorizing Provider  famotidine (PEPCID) 20 MG tablet Take 20 mg by mouth daily.   Yes [provider]  Multiple Vitamins-Minerals (EQ MULTIVITAMINS ADULT GUMMY) CHEW Chew by mouth. Chews 2 gummies a day.   Yes [provider]  Vitamin D, Ergocalciferol, (DRISDOL) 1.25 MG (50000 UNIT) CAPS capsule Take 50,000 Units by mouth once a week. Sunday 05/18/21  Yes [provider]    Allergies as of 07/02/2021   (No Known Allergies)    Family History  Problem Relation Age of Onset   Hypertension Mother     Social History   Socioeconomic History   Marital status: Married    Spouse name: Not on file   Number of children: Not on file   Years of education: Not on file   Highest education level: Not on file  Occupational History   Not on file  Tobacco Use   Smoking status: Former    Packs/day: 0.50    Types: Cigarettes   Smokeless tobacco: Never  Vaping Use   Vaping Use: Every day  Substance and Sexual Activity   Alcohol use: No    Alcohol/week: 0.0 standard drinks    Comment: weekends   Drug use: No   Sexual activity: Not on file  Other Topics Concern   Not on file  Social History Narrative   Not on file   Social Determinants of Health    Financial Resource Strain: Not on file  Food Insecurity: Not on file  Transportation Needs: Not on file  Physical Activity: Not on file  Stress: Not on file  Social Connections: Not on file  Intimate Partner Violence: Not on file    Review of Systems: See HPI, otherwise negative ROS  Physical Exam: Vital signs in last 24 hours: Temp:  [98.4 F (36.9 C)] 98.4 F (36.9 C) (12/20 1139) Pulse Rate:  [82] 82 (12/20 1139) Resp:  [20] 20 (12/20 1139) BP: (157)/(101) 157/101 (12/20 1139) SpO2:  [100 %] 100 % (12/20 1139) Weight:  [78.9 kg] 78.9 kg (12/20 1139)   General:   Alert,  Well-developed, well-nourished, pleasant and cooperative in NAD Head:  Normocephalic and atraumatic. Eyes:  Sclera clear, no icterus.   Conjunctiva pink. Ears:  Normal auditory acuity. Nose:  No deformity, discharge,  or lesions. Mouth:  No deformity or lesions, dentition normal. Neck:  Supple; no masses or thyromegaly. Lungs:  Clear throughout to auscultation.   No wheezes, crackles, or rhonchi. No acute distress. Heart:  Regular rate and rhythm; no murmurs, clicks, rubs,  or gallops. Abdomen:  Soft, nontender and nondistended. No masses, hepatosplenomegaly or hernias noted. Normal bowel sounds, without guarding, and without rebound.   Msk:  Symmetrical without gross deformities. Normal posture. Extremities:  Without clubbing or edema. Neurologic:  Alert and  oriented x4;  grossly normal neurologically. Skin:  Intact without significant lesions or rashes. Cervical Nodes:  No significant cervical adenopathy. Psych:  Alert and cooperative. Normal mood and affect.  Impression/Plan: Bethany Winters is here for a colonoscopy to be performed for colon cancer screening purposes.  The risks of the procedure including infection, bleed, or perforation as well as benefits, limitations, alternatives and imponderables have been reviewed with the patient. Questions have been answered. All parties agreeable.

## 2021-07-31 NOTE — Discharge Instructions (Addendum)
°  Colonoscopy Discharge Instructions  Read the instructions outlined below and refer to this sheet in the next few weeks. These discharge instructions provide you with general information on caring for yourself after you leave the hospital. Your doctor may also give you specific instructions. While your treatment has been planned according to the most current medical practices available, unavoidable complications occasionally occur.   ACTIVITY You may resume your regular activity, but move at a slower pace for the next 24 hours.  Take frequent rest periods for the next 24 hours.  Walking will help get rid of the air and reduce the bloated feeling in your belly (abdomen).  No driving for 24 hours (because of the medicine (anesthesia) used during the test).   Do not sign any important legal documents or operate any machinery for 24 hours (because of the anesthesia used during the test).  NUTRITION Drink plenty of fluids.  You may resume your normal diet as instructed by your doctor.  Begin with a light meal and progress to your normal diet. Heavy or fried foods are harder to digest and may make you feel sick to your stomach (nauseated).  Avoid alcoholic beverages for 24 hours or as instructed.  MEDICATIONS You may resume your normal medications unless your doctor tells you otherwise.  WHAT YOU CAN EXPECT TODAY Some feelings of bloating in the abdomen.  Passage of more gas than usual.  Spotting of blood in your stool or on the toilet paper.  IF YOU HAD POLYPS REMOVED DURING THE COLONOSCOPY: No aspirin products for 7 days or as instructed.  No alcohol for 7 days or as instructed.  Eat a soft diet for the next 24 hours.  FINDING OUT THE RESULTS OF YOUR TEST Not all test results are available during your visit. If your test results are not back during the visit, make an appointment with your caregiver to find out the results. Do not assume everything is normal if you have not heard from your  caregiver or the medical facility. It is important for you to follow up on all of your test results.  SEEK IMMEDIATE MEDICAL ATTENTION IF: You have more than a spotting of blood in your stool.  Your belly is swollen (abdominal distention).  You are nauseated or vomiting.  You have a temperature over 101.  You have abdominal pain or discomfort that is severe or gets worse throughout the day.   Your colonoscopy revealed 5 small polyp(s) which I removed successfully. Await pathology results, my office will contact you. I recommend repeating colonoscopy in 5 years for surveillance purposes. Otherwise follow up with GI as needed.    I hope you have a great rest of your week!  Elon Alas. Abbey Chatters, D.O. Gastroenterology and Hepatology [facility]

## 2021-07-31 NOTE — Anesthesia Preprocedure Evaluation (Addendum)
Anesthesia Evaluation  Patient identified by MRN, date of birth, ID band Patient awake    Reviewed: Allergy & Precautions, NPO status , Patient's Chart, lab work & pertinent test results  Airway Mallampati: II  TM Distance: >3 FB Neck ROM: Full    Dental  (+) Dental Advisory Given, Upper Dentures, Partial Lower   Pulmonary former smoker,    Pulmonary exam normal breath sounds clear to auscultation       Cardiovascular negative cardio ROS Normal cardiovascular exam Rhythm:Regular Rate:Normal     Neuro/Psych negative neurological ROS  negative psych ROS   GI/Hepatic Neg liver ROS, GERD  Medicated and Controlled,  Endo/Other  Hyperthyroidism   Renal/GU negative Renal ROS  negative genitourinary   Musculoskeletal negative musculoskeletal ROS (+)   Abdominal   Peds negative pediatric ROS (+)  Hematology negative hematology ROS (+)   Anesthesia Other Findings   Reproductive/Obstetrics negative OB ROS                            Anesthesia Physical Anesthesia Plan  ASA: 2  Anesthesia Plan: General   Post-op Pain Management: Minimal or no pain anticipated   Induction:   PONV Risk Score and Plan: TIVA  Airway Management Planned: Nasal Cannula and Natural Airway  Additional Equipment:   Intra-op Plan:   Post-operative Plan:   Informed Consent: I have reviewed the patients History and Physical, chart, labs and discussed the procedure including the risks, benefits and alternatives for the proposed anesthesia with the patient or authorized representative who has indicated his/her understanding and acceptance.     Dental advisory given  Plan Discussed with: CRNA and Surgeon  Anesthesia Plan Comments:         Anesthesia Quick Evaluation

## 2021-08-03 ENCOUNTER — Encounter (HOSPITAL_COMMUNITY): Payer: Self-pay | Admitting: Internal Medicine

## 2021-08-03 LAB — SURGICAL PATHOLOGY

## 2021-08-24 ENCOUNTER — Other Ambulatory Visit (HOSPITAL_COMMUNITY): Payer: Self-pay | Admitting: Family Medicine

## 2021-08-24 ENCOUNTER — Other Ambulatory Visit (HOSPITAL_COMMUNITY): Payer: Self-pay | Admitting: Internal Medicine

## 2021-08-24 DIAGNOSIS — Z1231 Encounter for screening mammogram for malignant neoplasm of breast: Secondary | ICD-10-CM

## 2021-10-01 ENCOUNTER — Ambulatory Visit (HOSPITAL_COMMUNITY)
Admission: RE | Admit: 2021-10-01 | Discharge: 2021-10-01 | Disposition: A | Discharge status: Home / Self Care | Payer: BC Managed Care – PPO | Source: Ambulatory Visit | Attending: Family Medicine | Admitting: Family Medicine

## 2021-10-01 ENCOUNTER — Other Ambulatory Visit: Payer: Self-pay

## 2021-10-01 DIAGNOSIS — Z1231 Encounter for screening mammogram for malignant neoplasm of breast: Secondary | ICD-10-CM | POA: Insufficient documentation

## 2022-08-20 ENCOUNTER — Other Ambulatory Visit (HOSPITAL_COMMUNITY): Payer: Self-pay | Admitting: Adult Health

## 2022-08-20 ENCOUNTER — Other Ambulatory Visit (HOSPITAL_COMMUNITY): Payer: Self-pay | Admitting: Family Medicine

## 2022-08-20 DIAGNOSIS — Z1231 Encounter for screening mammogram for malignant neoplasm of breast: Secondary | ICD-10-CM

## 2022-10-04 ENCOUNTER — Ambulatory Visit (HOSPITAL_COMMUNITY)
Admission: RE | Admit: 2022-10-04 | Discharge: 2022-10-04 | Disposition: A | Discharge status: Home / Self Care | Payer: BC Managed Care – PPO | Source: Ambulatory Visit | Attending: Family Medicine | Admitting: Family Medicine

## 2022-10-04 DIAGNOSIS — Z1231 Encounter for screening mammogram for malignant neoplasm of breast: Secondary | ICD-10-CM | POA: Diagnosis not present

## 2023-05-20 IMAGING — MG MM DIGITAL SCREENING BILAT W/ TOMO AND CAD
8 series · 8 of 24 positions shown · non-contrast
Comparison: Previous exam(s).

CLINICAL DATA: Screening.

EXAM:
DIGITAL SCREENING BILATERAL MAMMOGRAM WITH TOMOSYNTHESIS AND CAD
TECHNIQUE: Bilateral screening digital craniocaudal and mediolateral oblique
mammograms were obtained. Bilateral screening digital breast
tomosynthesis was performed. The images were evaluated with
computer-aided detection.

[R MLO synth-2D]
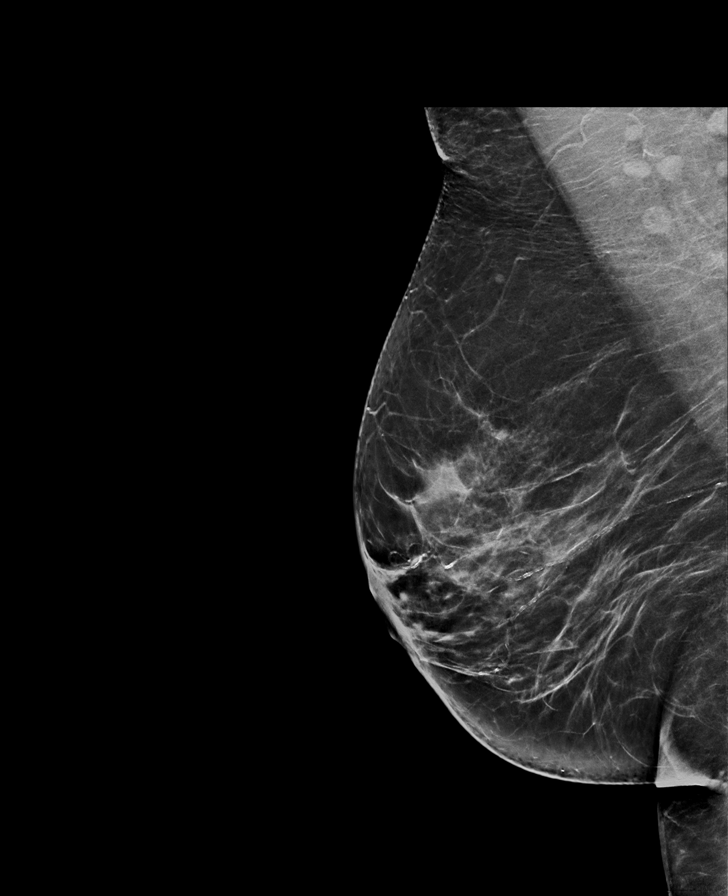

[L CC synth-2D]
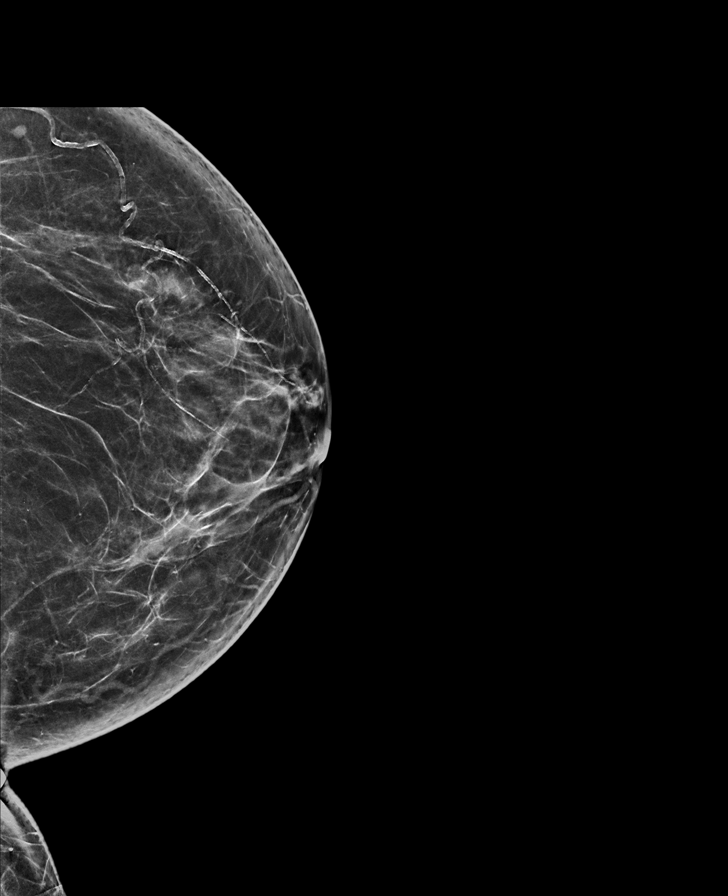

[R CC synth-2D]
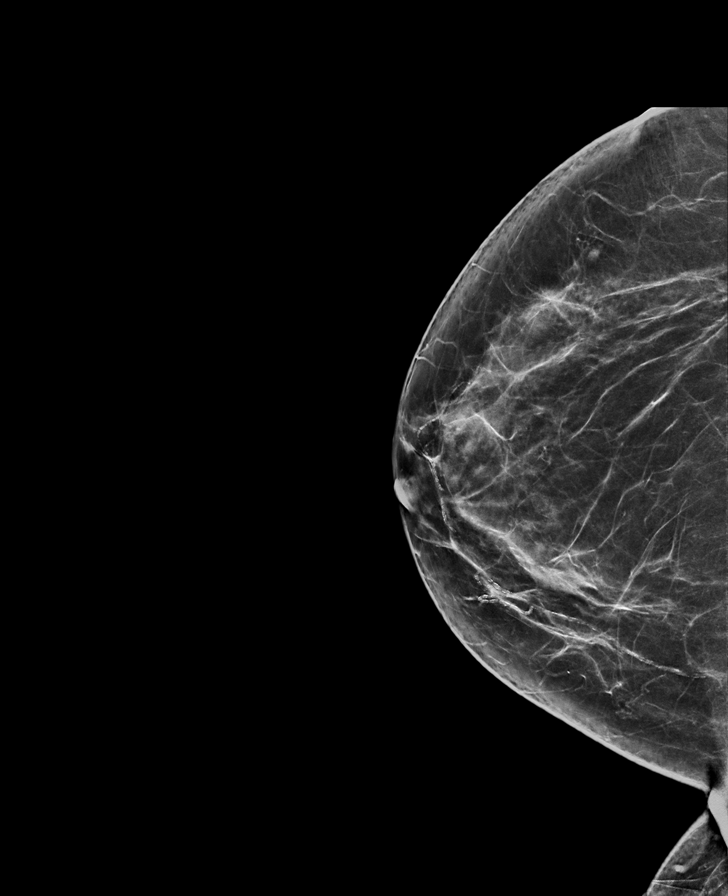

[L MLO synth-2D]
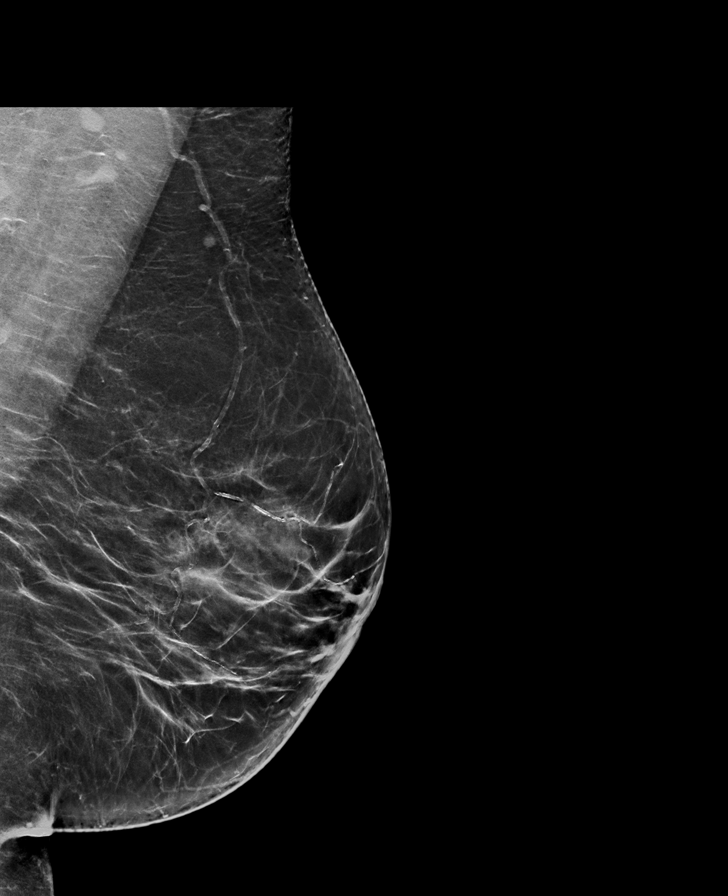

[L CC tomo · tomo slice 37/73.0]
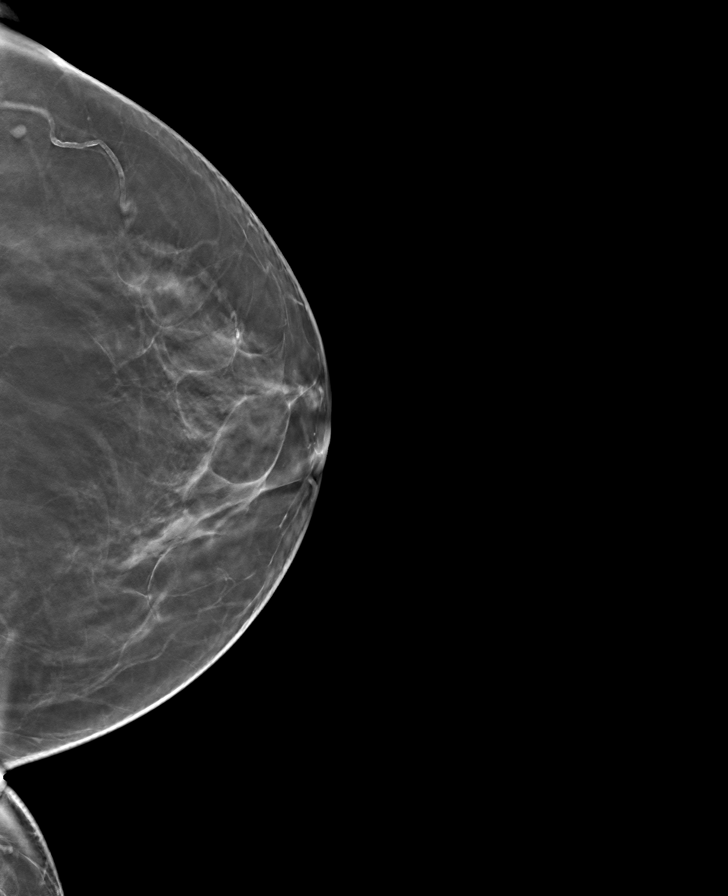

[R CC tomo · tomo slice 37/73.0]
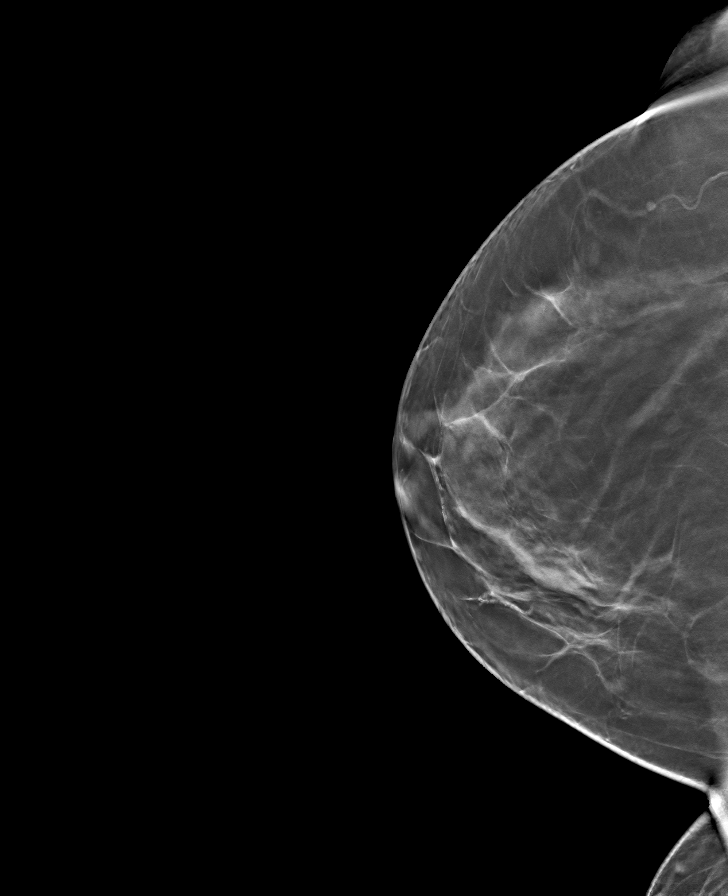

[R MLO tomo · tomo slice 41/81.0]
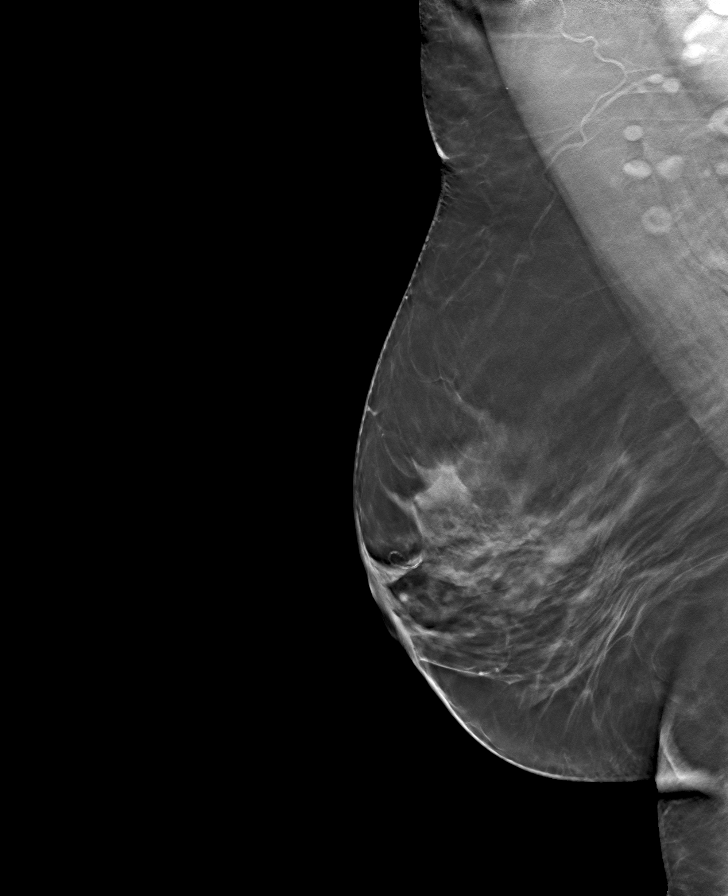

[L MLO tomo · tomo slice 39/78.0]
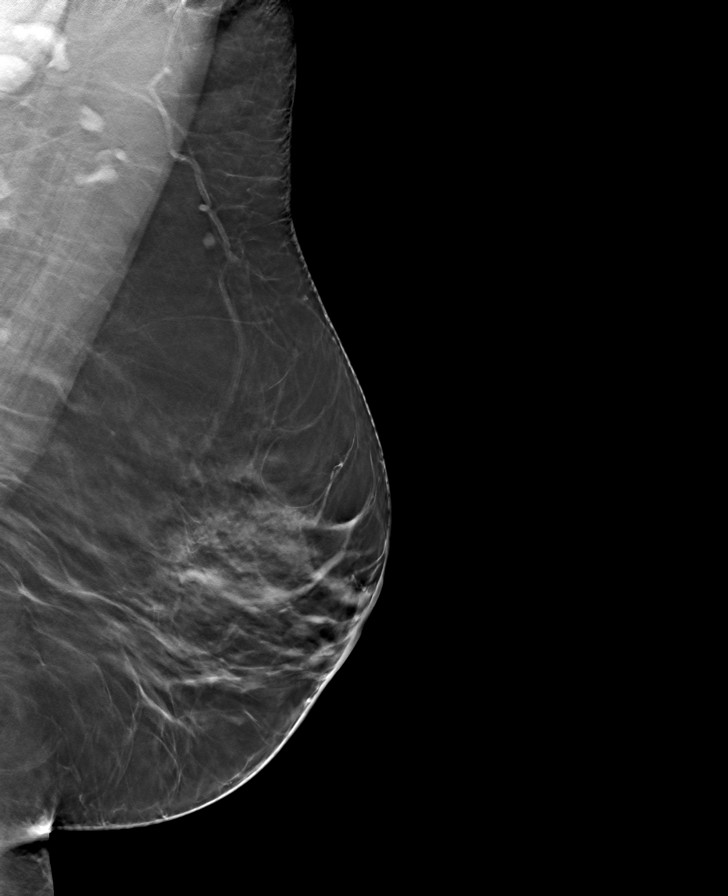

[8 of 24 positions shown; findings below may reference images not displayed]

ACR Breast Density Category b: There are scattered areas of
fibroglandular density.
FINDINGS: There are no findings suspicious for malignancy.
IMPRESSION: No mammographic evidence of malignancy. A result letter of this
screening mammogram will be mailed directly to the patient.

RECOMMENDATION:
Screening mammogram in one year. (Code:51-O-LD2)

BI-RADS CATEGORY  1: Negative.

## 2023-08-04 ENCOUNTER — Other Ambulatory Visit (HOSPITAL_COMMUNITY): Payer: Self-pay | Admitting: Family Medicine

## 2023-08-04 DIAGNOSIS — Z1231 Encounter for screening mammogram for malignant neoplasm of breast: Secondary | ICD-10-CM

## 2023-10-06 ENCOUNTER — Ambulatory Visit (HOSPITAL_COMMUNITY)
Admission: RE | Admit: 2023-10-06 | Discharge: 2023-10-06 | Disposition: A | Discharge status: Home / Self Care | Payer: BC Managed Care – PPO | Source: Ambulatory Visit | Attending: Family Medicine | Admitting: Family Medicine

## 2023-10-06 ENCOUNTER — Encounter (HOSPITAL_COMMUNITY): Payer: Self-pay

## 2023-10-06 DIAGNOSIS — Z1231 Encounter for screening mammogram for malignant neoplasm of breast: Secondary | ICD-10-CM | POA: Insufficient documentation

## 2024-08-13 ENCOUNTER — Other Ambulatory Visit (HOSPITAL_COMMUNITY): Payer: Self-pay | Admitting: Adult Health

## 2024-08-13 DIAGNOSIS — Z1231 Encounter for screening mammogram for malignant neoplasm of breast: Secondary | ICD-10-CM

## 2024-10-08 ENCOUNTER — Ambulatory Visit (HOSPITAL_COMMUNITY)
